# Patient Record
Sex: Female | Born: 1945 | Race: White | Hispanic: No | Marital: Married | Smoking: Never smoker
Health system: Southern US, Community
[De-identification: ages and names within clinical notes are randomized; demographics above are authoritative.]

## PROBLEM LIST (undated history)

## (undated) DIAGNOSIS — Z9889 Other specified postprocedural states: Secondary | ICD-10-CM

## (undated) DIAGNOSIS — K219 Gastro-esophageal reflux disease without esophagitis: Secondary | ICD-10-CM

## (undated) DIAGNOSIS — D126 Benign neoplasm of colon, unspecified: Secondary | ICD-10-CM

## (undated) DIAGNOSIS — I10 Essential (primary) hypertension: Secondary | ICD-10-CM

## (undated) DIAGNOSIS — K5790 Diverticulosis of intestine, part unspecified, without perforation or abscess without bleeding: Secondary | ICD-10-CM

## (undated) DIAGNOSIS — E785 Hyperlipidemia, unspecified: Secondary | ICD-10-CM

## (undated) DIAGNOSIS — Z8719 Personal history of other diseases of the digestive system: Secondary | ICD-10-CM

## (undated) DIAGNOSIS — R112 Nausea with vomiting, unspecified: Secondary | ICD-10-CM

## (undated) DIAGNOSIS — M1990 Unspecified osteoarthritis, unspecified site: Secondary | ICD-10-CM

## (undated) DIAGNOSIS — E039 Hypothyroidism, unspecified: Secondary | ICD-10-CM

## (undated) DIAGNOSIS — T7840XA Allergy, unspecified, initial encounter: Secondary | ICD-10-CM

## (undated) HISTORY — PX: THYROID SURGERY: SHX805

## (undated) HISTORY — PX: CERVICAL LAMINECTOMY: SHX94

## (undated) HISTORY — PX: UPPER GI ENDOSCOPY: SHX6162

## (undated) HISTORY — PX: CHOLECYSTECTOMY: SHX55

## (undated) HISTORY — DX: Diverticulosis of intestine, part unspecified, without perforation or abscess without bleeding: K57.90

## (undated) HISTORY — PX: WISDOM TOOTH EXTRACTION: SHX21

## (undated) HISTORY — DX: Hypothyroidism, unspecified: E03.9

## (undated) HISTORY — DX: Other specified postprocedural states: Z98.890

## (undated) HISTORY — DX: Gastro-esophageal reflux disease without esophagitis: K21.9

## (undated) HISTORY — DX: Benign neoplasm of colon, unspecified: D12.6

## (undated) HISTORY — PX: APPENDECTOMY: SHX54

## (undated) HISTORY — DX: Nausea with vomiting, unspecified: R11.2

## (undated) HISTORY — DX: Essential (primary) hypertension: I10

## (undated) HISTORY — PX: CERVICAL FUSION: SHX112

## (undated) HISTORY — DX: Unspecified osteoarthritis, unspecified site: M19.90

## (undated) HISTORY — DX: Hyperlipidemia, unspecified: E78.5

## (undated) HISTORY — PX: TONSILLECTOMY: SUR1361

## (undated) HISTORY — PX: ABLATION SAPHENOUS VEIN W/ RFA: SUR11

## (undated) HISTORY — DX: Allergy, unspecified, initial encounter: T78.40XA

---

## 2015-09-04 ENCOUNTER — Encounter: Payer: Self-pay | Admitting: "Endocrinology

## 2015-09-04 ENCOUNTER — Ambulatory Visit (INDEPENDENT_AMBULATORY_CARE_PROVIDER_SITE_OTHER): Payer: Medicare Other | Admitting: "Endocrinology

## 2015-09-04 VITALS — BP 121/78 | HR 81 | Ht 70.0 in | Wt 219.8 lb

## 2015-09-04 DIAGNOSIS — E890 Postprocedural hypothyroidism: Secondary | ICD-10-CM | POA: Diagnosis not present

## 2015-09-04 MED ORDER — LEVOTHYROXINE SODIUM 125 MCG PO TABS: 125.0000 ug | ORAL_TABLET | Freq: Every day | ORAL | Status: DC

## 2015-09-04 NOTE — Progress Notes (Signed)
Subjective:    Patient ID: Pamela Mckinney, female    DOB: 1946-08-28,    Past Medical History  Diagnosis Date  . Hyperlipidemia   . Hypothyroidism    Past Surgical History  Procedure Laterality Date  . Thyroid surgery    . Cholecystectomy    . Appendectomy     Social History   Social History  . Marital Status: Married    Spouse Name: N/A  . Number of Children: N/A  . Years of Education: N/A   Social History Main Topics  . Smoking status: Never Smoker   . Smokeless tobacco: Never Used  . Alcohol Use: Not on file  . Drug Use: Not on file  . Sexual Activity: Not on file   Other Topics Concern  . Not on file   Social History Narrative  . No narrative on file   Outpatient Encounter Prescriptions as of 09/04/2015  Medication Sig  . chlorthalidone (HYGROTON) 25 MG tablet Take 25 mg by mouth daily.  Marland Kitchen estradiol-norethindrone (ACTIVELLA) 1-0.5 MG tablet Take 1 tablet by mouth daily.  Marland Kitchen levothyroxine (SYNTHROID, LEVOTHROID) 125 MCG tablet Take 125 mcg by mouth daily before breakfast.  . lisinopril (PRINIVIL,ZESTRIL) 40 MG tablet Take 40 mg by mouth daily.  Marland Kitchen loratadine (CLARITIN) 10 MG tablet Take 10 mg by mouth daily.  . pantoprazole (PROTONIX) 40 MG tablet Take 40 mg by mouth daily.  . simvastatin (ZOCOR) 20 MG tablet Take 20 mg by mouth daily.   No facility-administered encounter medications on file as of 09/04/2015.   ALLERGIES: No Known Allergies VACCINATION STATUS:  There is no immunization history on file for this patient.  HPI  Pamela Mckinney is a 69 yr old female with medical hx as above. She is being seen in consultation for hypothyroidism . she reports hx of partial thyroidectomy 25 yrs ago for "cold nodule". she has taken thyroid hormone replacement at various doses over the years currently at 125 mcg. she is compliant. she denies cold/heat intolerance. she denies dysphagia, shortness of breath. she has had recent TFts which were found to be c/w  euthyroid status.  Review of Systems  Constitutional: + weight loss, no fatigue, no subjective hyperthermia/hypothermia Eyes: no blurry vision, no xerophthalmia ENT: no sore throat, no nodules palpated in throat, no dysphagia/odynophagia, no hoarseness Cardiovascular: no CP/SOB/palpitations/leg swelling Respiratory: no cough/SOB Gastrointestinal: no N/V/D/C Musculoskeletal: no muscle/joint aches Skin: no rashes Neurological: no tremors/numbness/tingling/dizziness Psychiatric: no depression/anxiety  Objective:    BP 121/78 mmHg  Pulse 81  Ht 5\' 10"  (1.778 m)  Wt 219 lb 12.8 oz (99.701 kg)  BMI 31.54 kg/m2  SpO2 96%  Wt Readings from Last 3 Encounters:  09/04/15 219 lb 12.8 oz (99.701 kg)    Physical Exam   Constitutional: overweight, in NAD Eyes: PERRLA, EOMI, no exophthalmos ENT: moist mucous membranes. Neck:  Post thyroidectomy scar on lower neck. No gross goiter.  no cervical lymphadenopathy Cardiovascular: RRR, No MRG Respiratory: CTA B Gastrointestinal: abdomen soft, NT, ND, BS+ Musculoskeletal: no deformities, strength intact in all 4 Skin: moist, warm, no rashes Neurological: no tremor with outstretched hands, DTR normal in all 4  On 08/28/2015 her free T4 was slightly elevated at 1.62 (normal 0.76-1.46), TSH 1.7.  Assessment & Plan:   1. Postsurgical hypothyroidism  I have reviewed her available records. she has established longstanding postsurgical hypothyroidism x 25 years after hemithyroidectomy for cold nodule.  she is clinically euthyroid. Her most recent thyroid function tests suggests over replacement which  would require a decrease in the dose of her Synthroid however since she is heading into the older season I would keep her on the same dose 125 g by mouth every morning. Once the court season is over will repeat thyroid function test and adjust the dose if necessary.  - We discussed about correct intake of levothyroxine, at fasting, with water,  separated by at least 30 minutes from breakfast, and separated by more than 4 hours from calcium, iron, multivitamins, acid reflux medications (PPIs). -Patient is made aware of the fact that thyroid hormone replacement is needed for life, dose to be adjusted by periodic monitoring of thyroid function tests.  Return in 5 months with repeat thyroid function test.   I advised patient to maintain close follow up with their PCP for primary care needs. Follow up plan: No Follow-up on file.  Glade Lloyd, MD Phone: [phone]  Fax: [phone]   09/04/2015, 8:53 AM

## 2015-09-23 ENCOUNTER — Encounter: Payer: Self-pay | Admitting: "Endocrinology

## 2016-01-29 LAB — TSH: TSH: 3.68 u[IU]/mL (ref <=5.90)

## 2016-02-05 ENCOUNTER — Encounter: Payer: Self-pay | Admitting: "Endocrinology

## 2016-02-05 ENCOUNTER — Ambulatory Visit (INDEPENDENT_AMBULATORY_CARE_PROVIDER_SITE_OTHER): Payer: Medicare Other | Admitting: "Endocrinology

## 2016-02-05 VITALS — BP 134/83 | HR 86 | Ht 70.0 in | Wt 225.0 lb

## 2016-02-05 DIAGNOSIS — E890 Postprocedural hypothyroidism: Secondary | ICD-10-CM

## 2016-02-05 MED ORDER — LEVOTHYROXINE SODIUM 125 MCG PO TABS: 125.0000 ug | ORAL_TABLET | Freq: Every day | ORAL | Status: DC

## 2016-02-05 NOTE — Progress Notes (Signed)
Subjective:    Patient ID: Pamela Mckinney, female    DOB: 11/01/1945,    Past Medical History  Diagnosis Date  . Hyperlipidemia   . Hypothyroidism   . Hypertension   . Osteoarthritis    Past Surgical History  Procedure Laterality Date  . Thyroid surgery    . Cholecystectomy    . Appendectomy    . Cervical laminectomy    . Cervical fusion    . Ablation saphenous vein w/ rfa     Social History   Social History  . Marital Status: Married    Spouse Name: N/A  . Number of Children: N/A  . Years of Education: N/A   Social History Main Topics  . Smoking status: Never Smoker   . Smokeless tobacco: Never Used  . Alcohol Use: Yes     Comment: occ wine  . Drug Use: No  . Sexual Activity: Not Asked   Other Topics Concern  . None   Social History Narrative   Outpatient Encounter Prescriptions as of 02/05/2016  Medication Sig  . cetirizine (ZYRTEC) 10 MG tablet Take 10 mg by mouth daily.  Marland Kitchen estradiol-norethindrone (ACTIVELLA) 1-0.5 MG tablet Take 1 tablet by mouth daily.  Marland Kitchen levothyroxine (SYNTHROID, LEVOTHROID) 125 MCG tablet Take 1 tablet (125 mcg total) by mouth daily before breakfast.  . lisinopril (PRINIVIL,ZESTRIL) 40 MG tablet Take 40 mg by mouth daily.  . pantoprazole (PROTONIX) 40 MG tablet Take 40 mg by mouth daily.  . simvastatin (ZOCOR) 20 MG tablet Take 20 mg by mouth daily.  . [DISCONTINUED] levothyroxine (SYNTHROID, LEVOTHROID) 125 MCG tablet Take 1 tablet (125 mcg total) by mouth daily before breakfast.  . loratadine (CLARITIN) 10 MG tablet Take 10 mg by mouth daily.  . [DISCONTINUED] chlorthalidone (HYGROTON) 25 MG tablet Take 25 mg by mouth daily.   No facility-administered encounter medications on file as of 02/05/2016.   ALLERGIES: No Known Allergies VACCINATION STATUS:  There is no immunization history on file for this patient.  HPI  Pamela Mckinney is a 70 yr old female with medical hx as above. She is being seen in consultation for hypothyroidism  . she reports hx of partial thyroidectomy 25 yrs ago for "cold nodule". she has taken thyroid hormone replacement at various doses over the years currently at 125 mcg. she is compliant. she denies cold/heat intolerance. she denies dysphagia, shortness of breath. she has had recent TFts which were found to be c/w euthyroid status.  Review of Systems  Constitutional:  + weight gain, no fatigue, no subjective hyperthermia/hypothermia Eyes: no blurry vision, no xerophthalmia ENT: no sore throat, no nodules palpated in throat, no dysphagia/odynophagia, no hoarseness Cardiovascular: no CP/SOB/palpitations/leg swelling Respiratory: no cough/SOB Gastrointestinal: no N/V/D/C Musculoskeletal: no muscle/joint aches Skin: no rashes Neurological: no tremors/numbness/tingling/dizziness Psychiatric: no depression/anxiety  Objective:    BP 134/83 mmHg  Pulse 86  Ht 5\' 10"  (1.778 m)  Wt 225 lb (102.059 kg)  BMI 32.28 kg/m2  SpO2 96%  Wt Readings from Last 3 Encounters:  02/05/16 225 lb (102.059 kg)  09/04/15 219 lb 12.8 oz (99.701 kg)    Physical Exam   Constitutional: overweight, in NAD Eyes: PERRLA, EOMI, no exophthalmos ENT: moist mucous membranes. Neck:  Post thyroidectomy scar on lower neck. No gross goiter.  no cervical lymphadenopathy Cardiovascular: RRR, No MRG Respiratory: CTA B Gastrointestinal: abdomen soft, NT, ND, BS+ Musculoskeletal: no deformities, strength intact in all 4 Skin: moist, warm, no rashes Neurological: no tremor with outstretched hands,  DTR normal in all 4  On 08/28/2015 her free T4 was slightly elevated at 1.62 (normal 0.76-1.46), TSH 1.7.  A previous 13th 2017 thyroid function tests showed: TSH 3.68 and free T4 1.42.  Assessment & Plan:   1. Postsurgical hypothyroidism  I have reviewed her available records. she has established longstanding postsurgical hypothyroidism x 25 years after hemithyroidectomy for cold nodule.   Her most recent thyroid  function tests suggests Appropriate replacement with thyroid hormone. I advised her to continue levothyroxine 125 g by mouth every morning.  - We discussed about correct intake of levothyroxine, at fasting, with water, separated by at least 30 minutes from breakfast, and separated by more than 4 hours from calcium, iron, multivitamins, acid reflux medications (PPIs). -Patient is made aware of the fact that thyroid hormone replacement is needed for life, dose to be adjusted by periodic monitoring of thyroid function tests.  Return in 5 months with repeat thyroid function test.   I advised patient to maintain close follow up with their PCP for primary care needs. Follow up plan: Return in about 1 year (around 02/04/2017) for follow up with pre-visit labs.  Glade Lloyd, MD Phone: [phone]  Fax: [phone]   02/05/2016, 11:14 AM

## 2016-02-17 ENCOUNTER — Encounter: Payer: Self-pay | Admitting: "Endocrinology

## 2016-08-18 HISTORY — PX: TOTAL KNEE ARTHROPLASTY: SHX125

## 2017-02-10 ENCOUNTER — Ambulatory Visit: Payer: Medicare Other | Admitting: "Endocrinology

## 2017-03-07 ENCOUNTER — Ambulatory Visit: Payer: Medicare Other | Admitting: "Endocrinology

## 2017-03-07 ENCOUNTER — Encounter: Payer: Self-pay | Admitting: "Endocrinology

## 2017-03-07 ENCOUNTER — Ambulatory Visit (INDEPENDENT_AMBULATORY_CARE_PROVIDER_SITE_OTHER): Payer: Medicare Other | Admitting: "Endocrinology

## 2017-03-07 VITALS — BP 131/85 | HR 86 | Ht 70.0 in | Wt 224.0 lb

## 2017-03-07 DIAGNOSIS — E890 Postprocedural hypothyroidism: Secondary | ICD-10-CM | POA: Diagnosis not present

## 2017-03-07 MED ORDER — LEVOTHYROXINE SODIUM 125 MCG PO TABS: 125.0000 ug | ORAL_TABLET | Freq: Every day | ORAL | 4 refills | Status: DC

## 2017-03-07 NOTE — Progress Notes (Signed)
Subjective:    Patient ID: Pamela Mckinney, female    DOB: Oct 05, 1946,    Past Medical History:  Diagnosis Date  . Hyperlipidemia   . Hypertension   . Hypothyroidism   . Osteoarthritis    Past Surgical History:  Procedure Laterality Date  . ABLATION SAPHENOUS VEIN W/ RFA    . APPENDECTOMY    . CERVICAL FUSION    . CERVICAL LAMINECTOMY    . CHOLECYSTECTOMY    . THYROID SURGERY     Social History   Social History  . Marital status: Married    Spouse name: N/A  . Number of children: N/A  . Years of education: N/A   Social History Main Topics  . Smoking status: Never Smoker  . Smokeless tobacco: Never Used  . Alcohol use Yes     Comment: occ wine  . Drug use: No  . Sexual activity: Not Asked   Other Topics Concern  . None   Social History Narrative  . None   Outpatient Encounter Prescriptions as of 03/07/2017  Medication Sig  . estradiol-norethindrone (ACTIVELLA) 1-0.5 MG tablet Take 1 tablet by mouth daily.  Marland Kitchen levothyroxine (SYNTHROID, LEVOTHROID) 125 MCG tablet Take 1 tablet (125 mcg total) by mouth daily before breakfast.  . lisinopril (PRINIVIL,ZESTRIL) 40 MG tablet Take 40 mg by mouth daily.  Marland Kitchen loratadine (CLARITIN) 10 MG tablet Take 10 mg by mouth daily.  . pantoprazole (PROTONIX) 40 MG tablet Take 40 mg by mouth daily.  . simvastatin (ZOCOR) 20 MG tablet Take 20 mg by mouth daily.  . [DISCONTINUED] cetirizine (ZYRTEC) 10 MG tablet Take 10 mg by mouth daily.   No facility-administered encounter medications on file as of 03/07/2017.    ALLERGIES: No Known Allergies VACCINATION STATUS:  There is no immunization history on file for this patient.  HPI  Pamela Mckinney is a 71 yr old female with medical hx as above. She is being seen in F/U for hypothyroidism . she reports hx of partial thyroidectomy 26 yrs ago for "cold nodule". she has taken thyroid hormone replacement at various doses over the years currently at 125 mcg. she is compliant. she denies  cold/heat intolerance. she denies dysphagia, shortness of breath. she has had recent TFts which included only TSH.  Review of Systems  Constitutional:  + steady weight, no fatigue, no subjective hyperthermia/hypothermia Eyes: no blurry vision, no xerophthalmia ENT: no sore throat, no nodules palpated in throat, no dysphagia/odynophagia, no hoarseness Cardiovascular: no CP/SOB/palpitations/leg swelling Respiratory: no cough/SOB Gastrointestinal: no N/V/D/C Musculoskeletal: no muscle/joint aches Skin: no rashes Neurological: no tremors/numbness/tingling/dizziness Psychiatric: no depression/anxiety  Objective:    BP 131/85   Pulse 86   Ht 5\' 10"  (1.778 m)   Wt 224 lb (101.6 kg)   BMI 32.14 kg/m   Wt Readings from Last 3 Encounters:  03/07/17 224 lb (101.6 kg)  02/05/16 225 lb (102.1 kg)  09/04/15 219 lb 12.8 oz (99.7 kg)    Physical Exam   Constitutional: overweight, in NAD Eyes: PERRLA, EOMI, no exophthalmos ENT: moist mucous membranes. Neck:  Post thyroidectomy scar on lower neck.  Palpable right lobe.  no cervical lymphadenopathy Cardiovascular: RRR, No MRG Respiratory: CTA B Gastrointestinal: abdomen soft, NT, ND, BS+ Musculoskeletal: healed scar on right knee after replacement. no deformities, strength intact in all 4 Skin: moist, warm, no rashes Neurological: no tremor with outstretched hands, DTR normal in all 4  On 08/28/2015 her free T4 was slightly elevated at 1.62 (normal 0.76-1.46), TSH  1.7.  January 29, 2016 thyroid function tests showed: TSH 3.68 and free T4 1.42. May 09,2018 TFT: TSH 3.03  Assessment & Plan:   1. Postsurgical hypothyroidism  I have reviewed her available records. she has established longstanding postsurgical hypothyroidism x 25 years after hemithyroidectomy for cold nodule.   Her most recent thyroid function tests suggests Appropriate replacement with thyroid hormone. I advised her to continue levothyroxine 125 g by mouth every  morning.  - We discussed about correct intake of levothyroxine, at fasting, with water, separated by at least 30 minutes from breakfast, and separated by more than 4 hours from calcium, iron, multivitamins, acid reflux medications (PPIs). -Patient is made aware of the fact that thyroid hormone replacement is needed for life, dose to be adjusted by periodic monitoring of thyroid function tests. I will obtain thyroid ultrasound given palpable lobe on right side.     I advised patient to maintain close follow up with their PCP for primary care needs. Follow up plan: Return in about 6 months (around 09/07/2017) for Thyroid / Neck Ultrasound, follow up with pre-visit labs.  Glade Lloyd, MD Phone: [phone]  Fax: [phone]   03/07/2017, 9:12 AM

## 2017-04-16 ENCOUNTER — Other Ambulatory Visit: Payer: Self-pay | Admitting: "Endocrinology

## 2017-08-29 ENCOUNTER — Encounter: Payer: Self-pay | Admitting: "Endocrinology

## 2017-09-01 LAB — TSH: TSH: 3.82 (ref 0.41–5.90)

## 2017-09-07 ENCOUNTER — Ambulatory Visit: Payer: Medicare Other | Admitting: "Endocrinology

## 2017-09-07 ENCOUNTER — Telehealth: Payer: Self-pay | Admitting: Gastroenterology

## 2017-09-12 ENCOUNTER — Encounter: Payer: Self-pay | Admitting: Gastroenterology

## 2017-09-13 ENCOUNTER — Encounter: Payer: Self-pay | Admitting: "Endocrinology

## 2017-09-13 ENCOUNTER — Ambulatory Visit (INDEPENDENT_AMBULATORY_CARE_PROVIDER_SITE_OTHER): Payer: Medicare Other | Admitting: "Endocrinology

## 2017-09-13 VITALS — BP 135/82 | HR 72 | Ht 70.0 in | Wt 228.0 lb

## 2017-09-13 DIAGNOSIS — E890 Postprocedural hypothyroidism: Secondary | ICD-10-CM

## 2017-09-13 MED ORDER — LEVOTHYROXINE SODIUM 150 MCG PO TABS: 150.0000 ug | ORAL_TABLET | Freq: Every day | ORAL | 2 refills | Status: DC

## 2017-09-13 NOTE — Progress Notes (Signed)
Subjective:    Patient ID: Pamela Mckinney, female    DOB: Feb 27, 1946,    Past Medical History:  Diagnosis Date  . Hyperlipidemia   . Hypertension   . Hypothyroidism   . Osteoarthritis    Past Surgical History:  Procedure Laterality Date  . ABLATION SAPHENOUS VEIN W/ RFA    . APPENDECTOMY    . CERVICAL FUSION    . CERVICAL LAMINECTOMY    . CHOLECYSTECTOMY    . THYROID SURGERY     Social History   Socioeconomic History  . Marital status: Married    Spouse name: None  . Number of children: None  . Years of education: None  . Highest education level: None  Social Needs  . Financial resource strain: None  . Food insecurity - worry: None  . Food insecurity - inability: None  . Transportation needs - medical: None  . Transportation needs - non-medical: None  Occupational History  . None  Tobacco Use  . Smoking status: Never Smoker  . Smokeless tobacco: Never Used  Substance and Sexual Activity  . Alcohol use: Yes    Comment: occ wine  . Drug use: No  . Sexual activity: None  Other Topics Concern  . None  Social History Narrative  . None   Outpatient Encounter Medications as of 09/13/2017  Medication Sig  . estradiol-norethindrone (ACTIVELLA) 1-0.5 MG tablet Take 1 tablet by mouth daily.  Marland Kitchen levothyroxine (SYNTHROID, LEVOTHROID) 150 MCG tablet Take 1 tablet (150 mcg total) by mouth daily before breakfast.  . lisinopril (PRINIVIL,ZESTRIL) 40 MG tablet Take 40 mg by mouth daily.  Marland Kitchen loratadine (CLARITIN) 10 MG tablet Take 10 mg by mouth daily.  . pantoprazole (PROTONIX) 40 MG tablet Take 40 mg by mouth daily.  . simvastatin (ZOCOR) 20 MG tablet Take 20 mg by mouth daily.  . [DISCONTINUED] levothyroxine (SYNTHROID, LEVOTHROID) 125 MCG tablet TAKE 1 TABLET BY MOUTH DAILY BEFORE BREAKFAST   No facility-administered encounter medications on file as of 09/13/2017.    ALLERGIES: No Known Allergies VACCINATION STATUS:  There is no immunization history on file for  this patient.  HPI  Pamela Mckinney is a 71 yr old female with medical history as above. 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  she reports history of left  partial thyroidectomy 26 yrs ago for "cold nodule". she has taken thyroid hormone replacement at various doses over the years currently at 125 mcg. she is compliant. she denies cold/heat intolerance. she denies dysphagia, shortness of breath.  Review of Systems  Constitutional:  +  Weight gain, no fatigue, no subjective hyperthermia/hypothermia Eyes: no blurry vision, no xerophthalmia ENT: no sore throat, no nodules palpated in throat, no dysphagia/odynophagia, no hoarseness Cardiovascular: no CP/SOB/palpitations/leg swelling Respiratory: no cough/SOB Gastrointestinal: no N/V/D/C Musculoskeletal: no muscle/joint aches Skin: no rashes Neurological: no tremors/numbness/tingling/dizziness Psychiatric: no depression/anxiety  Objective:    BP 135/82   Pulse 72   Ht 5\' 10"  (1.778 m)   Wt 228 lb (103.4 kg)   BMI 32.71 kg/m   Wt Readings from Last 3 Encounters:  09/13/17 228 lb (103.4 kg)  03/07/17 224 lb (101.6 kg)  02/05/16 225 lb (102.1 kg)    Physical Exam   Constitutional: overweight, in NAD Eyes: PERRLA, EOMI, no exophthalmos ENT: moist mucous membranes. Neck:  Post thyroidectomy scar on lower neck.  Palpable right lobe.  no cervical lymphadenopathy Cardiovascular: RRR, No MRG Respiratory: CTA B Gastrointestinal: abdomen soft, NT, ND, BS+ Musculoskeletal: healed  scar on right knee after replacement. no deformities, strength intact in all 4 Skin: moist, warm, no rashes Neurological: no tremor with outstretched hands, DTR normal in all 4  On 08/28/2015 her free T4 was slightly elevated at 1.62 (normal 0.76-1.46), TSH 1.7.  January 29, 2016 thyroid function tests showed: TSH 3.68 and free T4 1.42. 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  Assessment & Plan:   1. Postsurgical hypothyroidism  I have reviewed her available records. she has established longstanding postsurgical hypothyroidism x 25 years after hemithyroidectomy for cold nodule.   Her most recent thyroid function tests suggests underreplacement with thyroid hormone. - I advised her to increase her levothyroxine to 150 g by mouth every morning.   - We discussed about correct intake of levothyroxine, at fasting, with water, separated by at least 30 minutes from breakfast, and separated by more than 4 hours from calcium, iron, multivitamins, acid reflux medications (PPIs). -Patient is made aware of the fact that thyroid hormone replacement is needed for life, dose to be adjusted by periodic monitoring of thyroid function tests. Her thyroid ultrasound is unremarkable for any further nodular lesions. See above. No intervention required at this time.  I advised patient to maintain close follow up with her PCP for primary care needs. Follow up plan: Return in about 6 months (around 03/13/2018) for follow up with pre-visit labs.  Glade Lloyd, MD Phone: [phone]  Fax: [phone]  -  This note was partially dictated with voice recognition software. Similar sounding words can be transcribed inadequately or may not  be corrected upon review.  09/13/2017, 9:17 AM

## 2017-10-18 HISTORY — PX: JOINT REPLACEMENT: SHX530

## 2017-10-31 ENCOUNTER — Other Ambulatory Visit: Payer: Self-pay

## 2017-10-31 ENCOUNTER — Ambulatory Visit (AMBULATORY_SURGERY_CENTER): Payer: Self-pay | Admitting: *Deleted

## 2017-10-31 VITALS — Ht 70.0 in | Wt 224.0 lb

## 2017-10-31 DIAGNOSIS — Z86010 Personal history of colonic polyps: Secondary | ICD-10-CM

## 2017-10-31 MED ORDER — NA SULFATE-K SULFATE-MG SULF 17.5-3.13-1.6 GM/177ML PO SOLN: ORAL | 0 refills | Status: DC

## 2017-10-31 NOTE — Progress Notes (Signed)
Patient denies any allergies to eggs or soy. Patient does get post-op nausea and vomiting with anesthesia. Patient denies any problems with sedation. Patient denies any oxygen use at home. Patient denies taking any diet/weight loss medications or blood thinners. EMMI education declined by pt.

## 2017-11-03 ENCOUNTER — Telehealth: Payer: Self-pay | Admitting: Gastroenterology

## 2017-11-03 NOTE — Telephone Encounter (Signed)
Returned patients call. Patient states that the prep that she was given in [facility] was too expensive. Miralax prep was given as an alternative prep. Instructions will be mailed today. Reviewed prep instructions with patient and instructed to call back if they had further questions regarding instructions.    Riki Sheer, LPN  ( [facility])

## 2017-11-08 NOTE — Telephone Encounter (Signed)
Still has not received the instructions over the mail. Wants to speak to nurse Johann Capers or someone that can go over her prep over the phone. Says she lives 50 miles away and can not come gain or come pick up.

## 2017-11-08 NOTE — Telephone Encounter (Signed)
Spoke with patient. Explained that [place] LPN said the instructions were mailed out, so they should be at her house any day now. Told her what items (MIralax,Dulcolax and Gatorade) to buy and to call us back on Friday if they have not came in the mail and we will explain instructions over the phone. Pt understands. Patient states her Suprep was $90 and she could not afford that.

## 2017-11-15 ENCOUNTER — Other Ambulatory Visit: Payer: Self-pay

## 2017-11-15 ENCOUNTER — Ambulatory Visit (AMBULATORY_SURGERY_CENTER): Payer: Medicare Other | Admitting: Gastroenterology

## 2017-11-15 ENCOUNTER — Encounter: Payer: Self-pay | Admitting: Gastroenterology

## 2017-11-15 VITALS — BP 110/76 | HR 89 | Temp 98.0°F | Resp 13 | Ht 70.0 in | Wt 224.0 lb

## 2017-11-15 DIAGNOSIS — D122 Benign neoplasm of ascending colon: Secondary | ICD-10-CM | POA: Diagnosis not present

## 2017-11-15 DIAGNOSIS — Z86010 Personal history of colonic polyps: Secondary | ICD-10-CM

## 2017-11-15 MED ORDER — SODIUM CHLORIDE 0.9 % IV SOLN: 500.0000 mL | Freq: Once | INTRAVENOUS | Status: DC

## 2017-11-15 NOTE — Op Note (Signed)
[facility] Patient Name: Pamela Mckinney Procedure Date: 11/15/2017 10:10 AM MRN: [id number] Endoscopist: Remo Lipps P. Taylar Hartsough MD, MD Age: 72 Referring MD:  Date of Birth: 1946/03/18 Gender: Female Account #: [id number] Procedure:                Colonoscopy Indications:              High risk colon cancer surveillance: Personal                            history of colonic polyps Medicines:                Monitored Anesthesia Care Procedure:                Pre-Anesthesia Assessment:                           - Prior to the procedure, a History and Physical                            was performed, and patient medications and                            allergies were reviewed. The patient's tolerance of                            previous anesthesia was also reviewed. The risks                            and benefits of the procedure and the sedation                            options and risks were discussed with the patient.                            All questions were answered, and informed consent                            was obtained. Prior Anticoagulants: The patient has                            taken no previous anticoagulant or antiplatelet                            agents. ASA Grade Assessment: II - A patient with                            mild systemic disease. After reviewing the risks                            and benefits, the patient was deemed in                            satisfactory condition to undergo the procedure.  After obtaining informed consent, the colonoscope                            was passed under direct vision. Throughout the                            procedure, the patient's blood pressure, pulse, and                            oxygen saturations were monitored continuously. The                            Colonoscope was introduced through the anus and                            advanced to the the cecum,  identified by                            appendiceal orifice and ileocecal valve. The                            colonoscopy was performed without difficulty. The                            patient tolerated the procedure well. The quality                            of the bowel preparation was adequate. The                            ileocecal valve, appendiceal orifice, and rectum                            were photographed. Scope In: 10:17:57 AM Scope Out: 10:41:57 AM Scope Withdrawal Time: 0 hours 19 minutes 44 seconds  Total Procedure Duration: 0 hours 24 minutes 0 seconds  Findings:                 The perianal and digital rectal examinations were                            normal.                           Three sessile polyps were found in the ascending                            colon. The polyps were 3 to 6 mm in size (the                            largest was not photographed). These polyps were                            removed with a cold snare. Resection and retrieval  were complete.                           A few small-mouthed diverticula were found in the                            sigmoid colon.                           The colon was tortuous.                           The exam was otherwise without abnormality on                            direct and retroflexion views. Complications:            No immediate complications. Estimated blood loss:                            Minimal. Estimated Blood Loss:     Estimated blood loss was minimal. Impression:               - Three 3 to 6 mm polyps in the ascending colon,                            removed with a cold snare. Resected and retrieved.                           - Diverticulosis in the sigmoid colon.                           - Tortuous colon.                           - The examination was otherwise normal on direct                            and retroflexion views. Recommendation:            - Patient has a contact number available for                            emergencies. The signs and symptoms of potential                            delayed complications were discussed with the                            patient. Return to normal activities tomorrow.                            Written discharge instructions were provided to the                            patient.                           -  Resume previous diet.                           - Continue present medications.                           - Await pathology results.                           - Repeat colonoscopy is recommended for                            surveillance. The colonoscopy date will be                            determined after pathology results from today's                            exam become available for review. Remo Lipps P. Angeli Demilio MD, MD 11/15/2017 10:45:10 AM This report has been signed electronically.

## 2017-11-15 NOTE — Progress Notes (Signed)
Pt's states no medical or surgical changes since previsit or office visit. 

## 2017-11-15 NOTE — Patient Instructions (Signed)
YOU HAD AN ENDOSCOPIC PROCEDURE TODAY AT [facility]:   Refer to the procedure report that was given to you for any specific questions about what was found during the examination.  If the procedure report does not answer your questions, please call your gastroenterologist to clarify.  If you requested that your care partner not be given the details of your procedure findings, then the procedure report has been included in a sealed envelope for you to review at your convenience later.  YOU SHOULD EXPECT: Some feelings of bloating in the abdomen. Passage of more gas than usual.  Walking can help get rid of the air that was put into your GI tract during the procedure and reduce the bloating. If you had a lower endoscopy (such as a colonoscopy or flexible sigmoidoscopy) you may notice spotting of blood in your stool or on the toilet paper. If you underwent a bowel prep for your procedure, you may not have a normal bowel movement for a few days.  Please Note:  You might notice some irritation and congestion in your nose or some drainage.  This is from the oxygen used during your procedure.  There is no need for concern and it should clear up in a day or so.  SYMPTOMS TO REPORT IMMEDIATELY:   Following lower endoscopy (colonoscopy or flexible sigmoidoscopy):  Excessive amounts of blood in the stool  Significant tenderness or worsening of abdominal pains  Swelling of the abdomen that is new, acute  Fever of 100F or higher  Please see handouts given to you on Diverticulosis and Polyps.  For urgent or emergent issues, a gastroenterologist can be reached at any hour by calling [phone].   DIET:  We do recommend a small meal at first, but then you may proceed to your regular diet.  Drink plenty of fluids but you should avoid alcoholic beverages for 24 hours.  ACTIVITY:  You should plan to take it easy for the rest of today and you should NOT DRIVE or use heavy machinery until  tomorrow (because of the sedation medicines used during the test).    FOLLOW UP: Our staff will call the number listed on your records the next business day following your procedure to check on you and address any questions or concerns that you may have regarding the information given to you following your procedure. If we do not reach you, we will leave a message.  However, if you are feeling well and you are not experiencing any problems, there is no need to return our call.  We will assume that you have returned to your regular daily activities without incident.  If any biopsies were taken you will be contacted by phone or by letter within the next 1-3 weeks.  Please call us at [phone] if you have not heard about the biopsies in 3 weeks.    SIGNATURES/CONFIDENTIALITY: You and/or your care partner have signed paperwork which will be entered into your electronic medical record.  These signatures attest to the fact that that the information above on your After Visit Summary has been reviewed and is understood.  Full responsibility of the confidentiality of this discharge information lies with you and/or your care-partner.  Thank you for letting us take care of your healthcare needs today.

## 2017-11-15 NOTE — Progress Notes (Signed)
Called to room to assist during endoscopic procedure.  Patient ID and intended procedure confirmed with present staff. Received instructions for my participation in the procedure from the performing physician.  

## 2017-11-15 NOTE — Progress Notes (Signed)
Report given to PACU, vss 

## 2017-11-16 ENCOUNTER — Telehealth: Payer: Self-pay | Admitting: *Deleted

## 2017-11-16 NOTE — Telephone Encounter (Signed)
  Follow up Call-  Call back number 11/15/2017  Post procedure Call Back phone  # [phone]  Permission to leave phone message Yes     Patient questions:  Do you have a fever, pain , or abdominal swelling? No. Pain Score  0 *  Have you tolerated food without any problems? Yes.    Have you been able to return to your normal activities? Yes.    Do you have any questions about your discharge instructions: Diet   No. Medications  No. Follow up visit  No.  Do you have questions or concerns about your Care? No.  Actions: * If pain score is 4 or above: No action needed, pain <4.

## 2017-11-17 ENCOUNTER — Encounter: Payer: Self-pay | Admitting: Gastroenterology

## 2018-03-06 LAB — TSH: TSH: 0.38 — AB (ref 0.41–5.90)

## 2018-03-15 ENCOUNTER — Ambulatory Visit (INDEPENDENT_AMBULATORY_CARE_PROVIDER_SITE_OTHER): Payer: Medicare Other | Admitting: "Endocrinology

## 2018-03-15 ENCOUNTER — Encounter: Payer: Self-pay | Admitting: "Endocrinology

## 2018-03-15 VITALS — BP 129/84 | HR 85 | Ht 70.0 in | Wt 220.0 lb

## 2018-03-15 DIAGNOSIS — E890 Postprocedural hypothyroidism: Secondary | ICD-10-CM | POA: Diagnosis not present

## 2018-03-15 MED ORDER — LEVOTHYROXINE SODIUM 137 MCG PO TABS: 137.0000 ug | ORAL_TABLET | Freq: Every day | ORAL | 1 refills | Status: DC

## 2018-03-15 NOTE — Progress Notes (Signed)
Subjective:    Patient ID: Pamela Mckinney, female    DOB: 04/01/1946,    Past Medical History:  Diagnosis Date  . Allergy   . GERD (gastroesophageal reflux disease)   . Hyperlipidemia   . Hypertension   . Hypothyroidism   . Osteoarthritis   . Post-operative nausea and vomiting    Past Surgical History:  Procedure Laterality Date  . ABLATION SAPHENOUS VEIN W/ RFA    . APPENDECTOMY    . CERVICAL FUSION    . CERVICAL LAMINECTOMY    . CHOLECYSTECTOMY    . COLONOSCOPY    . THYROID SURGERY    . TONSILLECTOMY    . 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 Marital status: Married    Spouse name: Not on file  . Number of children: Not on file  . Years of education: Not on file  . Highest education level: Not on file  Occupational History  . Not on file  Social Needs  . Financial resource strain: Not on file  . Food insecurity:    Worry: Not on file    Inability: Not on file  . Transportation needs:    Medical: Not on file    Non-medical: Not on file  Tobacco Use  . Smoking status: Never Smoker  . Smokeless tobacco: Never Used  Substance and Sexual Activity  . Alcohol use: Yes    Comment: occ wine  . Drug use: No  . Sexual activity: Not on file  Lifestyle  . Physical activity:    Days per week: Not on file    Minutes per session: Not on file  . Stress: Not on file  Relationships  . Social connections:    Talks on phone: Not on file    Gets together: Not on file    Attends religious service: Not on file    Active member of club or organization: Not on file    Attends meetings of clubs or organizations: Not on file    Relationship status: Not on file  Other Topics Concern  . Not on file  Social History Narrative  . Not on file   Outpatient Encounter Medications as of 03/15/2018  Medication Sig  . Ibuprofen (ADVIL) 200 MG CAPS Take 1 capsule by mouth as needed.  Marland Kitchen levothyroxine (SYNTHROID, LEVOTHROID) 137 MCG  tablet Take 1 tablet (137 mcg total) by mouth daily before breakfast.  . lisinopril (PRINIVIL,ZESTRIL) 40 MG tablet Take 40 mg by mouth daily.  Marland Kitchen loratadine (CLARITIN) 10 MG tablet Take 10 mg by mouth daily.  . Multiple Vitamins-Minerals (CENTRUM ADULTS PO) Take 1 tablet by mouth daily.  . pantoprazole (PROTONIX) 40 MG tablet Take 40 mg by mouth daily.  . simvastatin (ZOCOR) 20 MG tablet Take 20 mg by mouth daily.  . [DISCONTINUED] levothyroxine (SYNTHROID, LEVOTHROID) 150 MCG tablet Take 1 tablet (150 mcg total) by mouth daily before breakfast.   Facility-Administered Encounter Medications as of 03/15/2018  Medication  . 0.9 %  sodium chloride infusion   ALLERGIES: Allergies  Allergen Reactions  . Codeine Nausea And Vomiting and Rash    Any pain medication   VACCINATION STATUS:  There is no immunization history on file for this patient.  HPI  Pamela Mckinney is a 72 year old female with medical history as above.  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    she reports history of left  partial thyroidectomy 26 yrs ago for "  cold nodule". she has taken thyroid hormone replacement at various doses over the years currently at 150 mcg. she is compliant. she denies cold/heat intolerance. she denies dysphagia, shortness of breath.  She denies tremors, palpitations, sleep disturbance.  Reports some mild hot flashes.  Review of Systems  Constitutional:  +  Weight loss, no fatigue, no subjective hyperthermia/hypothermia Eyes: no blurry vision, no xerophthalmia ENT: no sore throat, no nodules palpated in throat, no dysphagia/odynophagia, no hoarseness Cardiovascular: no CP/SOB/palpitations/leg swelling Respiratory: no cough/SOB Gastrointestinal: no N/V/D/C Musculoskeletal: no muscle/joint aches Skin: no rashes Neurological: no tremors/numbness/tingling/dizziness Psychiatric: no depression/anxiety  Objective:    BP 129/84   Pulse 85   Ht 5\' 10"  (1.778 m)   Wt 220  lb (99.8 kg)   BMI 31.57 kg/m   Wt Readings from Last 3 Encounters:  03/15/18 220 lb (99.8 kg)  11/15/17 224 lb (101.6 kg)  10/31/17 224 lb (101.6 kg)    Physical Exam   Constitutional:  overweight, not in acute distress.   Eyes: PERRLA, EOMI, no exophthalmos ENT: moist mucous membranes. Neck:  Post thyroidectomy scar on lower neck.  Palpable right lobe.  no cervical lymphadenopathy Musculoskeletal: healed scar on right knee after replacement.  Awaiting left knee replacement.    Skin: moist, warm, no rashes Neurological: no tremor with outstretched hands  Mar 06, 2018: Free T4 1.7 to elevated, TSH 0.38  On 08/28/2015 her free T4 was slightly elevated at 1.62 (normal 0.76-1.46), TSH 1.7.  January 29, 2016 thyroid function tests showed: TSH 3.68 and free T4 1.42. 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  Assessment & Plan:   1. Postsurgical hypothyroidism  I have reviewed her available records. she has established longstanding postsurgical hypothyroidism x 25 years after hemithyroidectomy for cold nodule.   Her most recent thyroid function tests suggests slight over replacement.   - I discussed and lowered her levothyroxine to 137 mcg p.o. A.m.   - We discussed about correct intake of levothyroxine, at fasting, with water, separated by at least 30 minutes from breakfast, and separated by more than 4 hours from calcium, iron, multivitamins, acid reflux medications (PPIs). -Patient is made aware of the fact that thyroid hormone replacement is needed for life, dose to be adjusted by periodic monitoring of thyroid function tests.  Her thyroid ultrasound is unremarkable for any further nodular lesions. See above. No intervention required at this time.  I advised patient to maintain close follow up with her PCP for  primary care needs. Follow up plan: Return in about 6 months (around 09/15/2018) for follow up with pre-visit labs.  Glade Lloyd, MD Phone: [phone]  Fax: [phone]  -  This note was partially dictated with voice recognition software. Similar sounding words can be transcribed inadequately or may not  be corrected upon review.  03/15/2018, 9:30 AM

## 2018-08-28 ENCOUNTER — Encounter: Payer: Self-pay | Admitting: "Endocrinology

## 2018-09-18 ENCOUNTER — Ambulatory Visit (INDEPENDENT_AMBULATORY_CARE_PROVIDER_SITE_OTHER): Payer: Medicare Other | Admitting: "Endocrinology

## 2018-09-18 ENCOUNTER — Encounter: Payer: Self-pay | Admitting: "Endocrinology

## 2018-09-18 VITALS — BP 130/81 | HR 87 | Ht 70.0 in | Wt 229.0 lb

## 2018-09-18 DIAGNOSIS — E890 Postprocedural hypothyroidism: Secondary | ICD-10-CM

## 2018-09-18 MED ORDER — LEVOTHYROXINE SODIUM 137 MCG PO TABS: 137.0000 ug | ORAL_TABLET | Freq: Every day | ORAL | 4 refills | Status: DC

## 2018-09-18 NOTE — Progress Notes (Signed)
Endocrinology follow-up note   Subjective:    Patient ID: Pamela Mckinney, female    DOB: January 26, 1946,    Past Medical History:  Diagnosis Date  . Allergy   . GERD (gastroesophageal reflux disease)   . Hyperlipidemia   . Hypertension   . Hypothyroidism   . Osteoarthritis   . Post-operative nausea and vomiting    Past Surgical History:  Procedure Laterality Date  . ABLATION SAPHENOUS VEIN W/ RFA    . APPENDECTOMY    . CERVICAL FUSION    . CERVICAL LAMINECTOMY    . CHOLECYSTECTOMY    . COLONOSCOPY    . THYROID SURGERY    . TONSILLECTOMY    . 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 Marital status: Married    Spouse name: Not on file  . Number of children: Not on file  . Years of education: Not on file  . Highest education level: Not on file  Occupational History  . Not on file  Social Needs  . Financial resource strain: Not on file  . Food insecurity:    Worry: Not on file    Inability: Not on file  . Transportation needs:    Medical: Not on file    Non-medical: Not on file  Tobacco Use  . Smoking status: Never Smoker  . Smokeless tobacco: Never Used  Substance and Sexual Activity  . Alcohol use: Yes    Comment: occ wine  . Drug use: No  . Sexual activity: Not on file  Lifestyle  . Physical activity:    Days per week: Not on file    Minutes per session: Not on file  . Stress: Not on file  Relationships  . Social connections:    Talks on phone: Not on file    Gets together: Not on file    Attends religious service: Not on file    Active member of club or organization: Not on file    Attends meetings of clubs or organizations: Not on file    Relationship status: Not on file  Other Topics Concern  . Not on file  Social History Narrative  . Not on file   Outpatient Encounter Medications as of 09/18/2018  Medication Sig  . Ibuprofen (ADVIL) 200 MG CAPS Take 1 capsule by mouth as needed.  Marland Kitchen levothyroxine  (SYNTHROID, LEVOTHROID) 137 MCG tablet Take 1 tablet (137 mcg total) by mouth daily before breakfast.  . lisinopril (PRINIVIL,ZESTRIL) 40 MG tablet Take 40 mg by mouth daily.  Marland Kitchen loratadine (CLARITIN) 10 MG tablet Take 10 mg by mouth daily.  . Multiple Vitamins-Minerals (CENTRUM ADULTS PO) Take 1 tablet by mouth daily.  . pantoprazole (PROTONIX) 40 MG tablet Take 40 mg by mouth daily.  . simvastatin (ZOCOR) 20 MG tablet Take 20 mg by mouth daily.  . [DISCONTINUED] levothyroxine (SYNTHROID, LEVOTHROID) 137 MCG tablet Take 1 tablet (137 mcg total) by mouth daily before breakfast.   Facility-Administered Encounter Medications as of 09/18/2018  Medication  . 0.9 %  sodium chloride infusion   ALLERGIES: Allergies  Allergen Reactions  . Codeine Nausea And Vomiting and Rash    Any pain medication   VACCINATION STATUS:  There is no immunization history on file for this patient.  HPI  Pamela Mckinney is a 72 year old female with medical history as above.  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    she reports history of left  partial thyroidectomy  at approximate age of 6 years due to  "cold nodule". she has taken thyroid hormone replacement at various doses over the years currently at 137 mcg. she is compliant. she denies palpitations, tremors, nor weight loss.  She reports some intermittent hot flashes.   she denies dysphagia, shortness of breath.  Review of Systems  Constitutional:  +  Weight gain,  no fatigue, no subjective hyperthermia/hypothermia Eyes: no blurry vision, no xerophthalmia ENT: no sore throat, no nodules palpated in throat, no dysphagia/odynophagia, no hoarseness Musculoskeletal: no muscle/joint aches Skin: no rashes Neurological: no tremors/numbness/tingling/dizziness Psychiatric: no depression/anxiety  Objective:    BP 130/81   Pulse 87   Ht 5\' 10"  (1.778 m)   Wt 229 lb (103.9 kg)   BMI 32.86 kg/m   Wt Readings from Last 3 Encounters:   09/18/18 229 lb (103.9 kg)  03/15/18 220 lb (99.8 kg)  11/15/17 224 lb (101.6 kg)    Physical Exam   Constitutional:  overweight, not in acute distress.   Eyes: PERRLA, EOMI, no exophthalmos ENT: moist mucous membranes. Neck:  Post thyroidectomy scar on lower neck.  Palpable right lobe.  no cervical lymphadenopathy Musculoskeletal: healed scar on right knee after replacement.  Awaiting left knee replacement.    Skin: moist, warm, no rashes Neurological: no tremor with outstretched hands    August 28, 2018 labs: Free T4 1.4 4, TSH 4.26 Mar 06, 2018: Free T4 1.7 to elevated, TSH 0.38 On 08/28/2015 her free T4 was slightly elevated at 1.62 (normal 0.76-1.46), TSH 1.7.  January 29, 2016 thyroid function tests showed: TSH 3.68 and free T4 1.42. 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  Assessment & Plan:   1. Postsurgical hypothyroidism  I have reviewed her available records. she has established longstanding postsurgical hypothyroidism at approximate age of 56 years  after left hemithyroidectomy for cold nodule.   Her previsit labs suggest appropriate replacement. -She is advised to continue levothyroxine 137 mcg p.o. every morning.   - We discussed about correct intake of levothyroxine, at fasting, with water, separated by at least 30 minutes from breakfast, and separated by more than 4 hours from calcium, iron, multivitamins, acid reflux medications (PPIs). -Patient is made aware of the fact that thyroid hormone replacement is needed for life, dose to be adjusted by periodic monitoring of thyroid function tests.  Her thyroid ultrasound is unremarkable for any further nodular lesions. See above. No intervention required at this time.  I advised patient to maintain close follow up with her PCP for primary care  needs. Follow up plan: Return in about 1 year (around 09/19/2019) for Follow up with Pre-visit Labs.  Glade Lloyd, MD Phone: [phone]  Fax: [phone]  -  This note was partially dictated with voice recognition software. Similar sounding words can be transcribed inadequately or may not  be corrected upon review.  09/18/2018, 10:11 AM

## 2018-10-18 HISTORY — PX: COLONOSCOPY: SHX174

## 2019-03-07 ENCOUNTER — Other Ambulatory Visit: Payer: Self-pay | Admitting: Neurosurgery

## 2019-03-19 HISTORY — PX: LUMBAR FUSION: SHX111

## 2019-04-03 ENCOUNTER — Other Ambulatory Visit: Payer: Self-pay | Admitting: Neurosurgery

## 2019-04-12 NOTE — Progress Notes (Signed)
Patient informed of the [place] that is currently in effect.  Patient verbalized understanding.  Patient denies shortness of breath, fever, cough and chest pain at PAT appointment  PCP - Dr Shearon Stalls in [place], [place] - Dr Gibson Ramp in [place], [place]  Endocrinologist: Dr Dorris Fetch in [place], [place]    Chest x-ray - Denies EKG - 04/13/19 Stress Test -Denies  ECHO - Denies Cardiac Cath - Denies  Anesthesia review: Yes  (03/22/19 EKG and cardiac clearance placed on chart along with copy of Advance Directive.)   Coronavirus Screening Have you or your daughter/husband experienced the following symptoms:  Cough yes/no: No Fever (>100.25F)  yes/no: No Runny nose yes/no: No Sore throat yes/no: No Difficulty breathing/shortness of breath  yes/no: No  Have you or your daughter/husband traveled in the last 14 days and where? yes/no: No

## 2019-04-12 NOTE — Pre-Procedure Instructions (Signed)
ELLISYN ICENHOWER  04/12/2019      [id number] [address], [place] [address] Phone: [phone] Fax: [phone]    Your procedure is scheduled on April 17, 2019.  Report to [facility] Entrance "A" at 800 AM.  Call this number if you have problems the morning of surgery:  [phone]   Remember:  Do not eat or drink after midnight.   Take these medicines the morning of surgery with A SIP OF WATER  Levothyroxine (synthroid) Loratadine (Claritin) Pantoprazole (Protonix)  7 days prior to surgery STOP taking any Aspirin (unless otherwise instructed by your surgeon), Aleve, Naproxen, Ibuprofen, Motrin, Advil, Goody's, BC's, all herbal medications, fish oil, and all vitamins     Do not wear jewelry, make-up or nail polish.  Do not wear lotions, powders, or perfumes, or deodorant.  Do not shave 48 hours prior to surgery.   Do not bring valuables to the hospital.  [facility] is not responsible for any belongings or valuables.  Contacts, dentures or bridgework may not be worn into surgery.  Leave your suitcase in the car.  After surgery it may be brought to your room.  For patients admitted to the hospital, discharge time will be determined by your treatment team.  Patients discharged the day of surgery will not be allowed to drive home.    [facility]- Preparing For Surgery  Before surgery, you can play an important role. Because skin is not sterile, your skin needs to be as free of germs as possible. You can reduce the number of germs on your skin by washing with CHG (chlorahexidine gluconate) Soap before surgery.  CHG is an antiseptic cleaner which kills germs and bonds with the skin to continue killing germs even after washing.    Oral Hygiene is also important to reduce your risk of infection.  Remember - BRUSH YOUR TEETH THE MORNING OF SURGERY WITH YOUR REGULAR TOOTHPASTE  Please do not use if you have an allergy to CHG or  antibacterial soaps. If your skin becomes reddened/irritated stop using the CHG.  Do not shave (including legs and underarms) for at least 48 hours prior to first CHG shower. It is OK to shave your face.  Please follow these instructions carefully.   1. Shower the NIGHT BEFORE SURGERY and the MORNING OF SURGERY with CHG.   2. If you chose to wash your hair, wash your hair first as usual with your normal shampoo.  3. After you shampoo, rinse your hair and body thoroughly to remove the shampoo.  4. Use CHG as you would any other liquid soap. You can apply CHG directly to the skin and wash gently with a scrungie or a clean washcloth.   5. Apply the CHG Soap to your body ONLY FROM THE NECK DOWN.  Do not use on open wounds or open sores. Avoid contact with your eyes, ears, mouth and genitals (private parts). Wash Face and genitals (private parts)  with your normal soap.  6. Wash thoroughly, paying special attention to the area where your surgery will be performed.  7. Thoroughly rinse your body with warm water from the neck down.  8. DO NOT shower/wash with your normal soap after using and rinsing off the CHG Soap.  9. Pat yourself dry with a CLEAN TOWEL.  10. Wear CLEAN PAJAMAS to bed the night before surgery, wear comfortable clothes the morning of surgery  11. Place CLEAN  SHEETS on your bed the night of your first shower and DO NOT SLEEP WITH PETS.    Day of Surgery:  Do not apply any deodorants/lotions.  Please wear clean clothes to the hospital/surgery center.   Remember to brush your teeth WITH YOUR REGULAR TOOTHPASTE.  Please read over the following fact sheets that you were given.

## 2019-04-13 ENCOUNTER — Encounter (HOSPITAL_COMMUNITY)
Admission: RE | Admit: 2019-04-13 | Discharge: 2019-04-13 | Disposition: A | Discharge status: Home / Self Care | Payer: Medicare Other | Source: Ambulatory Visit | Attending: Neurosurgery | Admitting: Neurosurgery

## 2019-04-13 ENCOUNTER — Other Ambulatory Visit: Payer: Self-pay

## 2019-04-13 ENCOUNTER — Other Ambulatory Visit (HOSPITAL_COMMUNITY)
Admission: RE | Admit: 2019-04-13 | Discharge: 2019-04-13 | Disposition: A | Discharge status: Home / Self Care | Payer: Medicare Other | Source: Ambulatory Visit | Attending: Neurosurgery | Admitting: Neurosurgery

## 2019-04-13 ENCOUNTER — Encounter (HOSPITAL_COMMUNITY): Payer: Self-pay

## 2019-04-13 DIAGNOSIS — E890 Postprocedural hypothyroidism: Secondary | ICD-10-CM | POA: Insufficient documentation

## 2019-04-13 DIAGNOSIS — K219 Gastro-esophageal reflux disease without esophagitis: Secondary | ICD-10-CM | POA: Insufficient documentation

## 2019-04-13 DIAGNOSIS — Z79899 Other long term (current) drug therapy: Secondary | ICD-10-CM | POA: Diagnosis not present

## 2019-04-13 DIAGNOSIS — I10 Essential (primary) hypertension: Secondary | ICD-10-CM | POA: Diagnosis not present

## 2019-04-13 DIAGNOSIS — Z1159 Encounter for screening for other viral diseases: Secondary | ICD-10-CM | POA: Diagnosis not present

## 2019-04-13 DIAGNOSIS — E785 Hyperlipidemia, unspecified: Secondary | ICD-10-CM | POA: Diagnosis not present

## 2019-04-13 DIAGNOSIS — M48062 Spinal stenosis, lumbar region with neurogenic claudication: Secondary | ICD-10-CM | POA: Insufficient documentation

## 2019-04-13 DIAGNOSIS — Z01812 Encounter for preprocedural laboratory examination: Secondary | ICD-10-CM | POA: Diagnosis present

## 2019-04-13 DIAGNOSIS — Z79890 Hormone replacement therapy (postmenopausal): Secondary | ICD-10-CM | POA: Insufficient documentation

## 2019-04-13 HISTORY — DX: Personal history of other diseases of the digestive system: Z87.19

## 2019-04-13 LAB — CBC
HCT: 46.6 % — ABNORMAL HIGH (ref 36.0–46.0)
Hemoglobin: 14.8 g/dL (ref 12.0–15.0)
MCH: 28.7 pg (ref 26.0–34.0)
MCHC: 31.8 g/dL (ref 30.0–36.0)
MCV: 90.3 fL (ref 80.0–100.0)
Platelets: 188 10*3/uL (ref 150–400)
RBC: 5.16 MIL/uL — ABNORMAL HIGH (ref 3.87–5.11)
RDW: 13.9 % (ref 11.5–15.5)
WBC: 9.6 10*3/uL (ref 4.0–10.5)
nRBC: 0 % (ref 0.0–0.2)

## 2019-04-13 LAB — BASIC METABOLIC PANEL
Anion gap: 8 (ref 5–15)
BUN: 19 mg/dL (ref 8–23)
CO2: 27 mmol/L (ref 22–32)
Calcium: 8.9 mg/dL (ref 8.9–10.3)
Chloride: 105 mmol/L (ref 98–111)
Creatinine, Ser: 0.96 mg/dL (ref 0.44–1.00)
GFR calc Af Amer: >60 mL/min (ref >60)
GFR calc non Af Amer: 58 mL/min — ABNORMAL LOW (ref >60)
Glucose, Bld: 97 mg/dL (ref 70–99)
Potassium: 5 mmol/L (ref 3.5–5.1)
Sodium: 140 mmol/L (ref 135–145)

## 2019-04-13 LAB — TYPE AND SCREEN
ABO/RH(D): A POS
Antibody Screen: NEGATIVE

## 2019-04-13 LAB — SURGICAL PCR SCREEN
MRSA, PCR: NEGATIVE
Staphylococcus aureus: NEGATIVE

## 2019-04-13 LAB — SARS CORONAVIRUS 2 (TAT 6-24 HRS): SARS Coronavirus 2: NEGATIVE

## 2019-04-13 LAB — ABO/RH: ABO/RH(D): A POS

## 2019-04-16 NOTE — Anesthesia Preprocedure Evaluation (Addendum)
Anesthesia Evaluation  Patient identified by MRN, date of birth, ID band Patient awake    Reviewed: Allergy & Precautions, NPO status , Patient's Chart, lab work & pertinent test results  History of Anesthesia Complications (+) PONVNegative for: history of anesthetic complications  Airway Mallampati: III  TM Distance: >3 FB Neck ROM: Limited    Dental  (+) Teeth Intact   Pulmonary neg pulmonary ROS,    Pulmonary exam normal        Cardiovascular hypertension, Pt. on medications Normal cardiovascular exam     Neuro/Psych negative neurological ROS  negative psych ROS   GI/Hepatic Neg liver ROS, hiatal hernia, GERD  ,  Endo/Other  Hypothyroidism   Renal/GU negative Renal ROS  negative genitourinary   Musculoskeletal negative musculoskeletal ROS (+)   Abdominal   Peds  Hematology negative hematology ROS (+)   Anesthesia Other Findings   Reproductive/Obstetrics                           Anesthesia Physical Anesthesia Plan  ASA: II  Anesthesia Plan: General   Post-op Pain Management:    Induction: Intravenous  PONV Risk Score and Plan: 4 or greater and Ondansetron, Dexamethasone, Scopolamine patch - Pre-op, Propofol infusion and Treatment may vary due to age or medical condition  Airway Management Planned: Oral ETT and Video Laryngoscope Planned  Additional Equipment: None  Intra-op Plan:   Post-operative Plan: Extubation in OR  Informed Consent: I have reviewed the patients History and Physical, chart, labs and discussed the procedure including the risks, benefits and alternatives for the proposed anesthesia with the patient or authorized representative who has indicated his/her understanding and acceptance.     Dental advisory given  Plan Discussed with:   Anesthesia Plan Comments: (Follows with cardiologist, Dr. Gibson Ramp, for HTN, HLD, venous insufficiency. She was seen  03/22/19 and cleared for surgery, "Revised cardiac index of 0. This puts her to 0.4% risk for perioperative complications. Proceed with surgery without further cardiac workup." OV note also states "Lexiscan 03-03-15, EF 70/normal."  EKG 03/22/19 (copy on pt chart): Sinus rhythm. Rate 82. Short PR. Nonspecific ST depression.)     Anesthesia Quick Evaluation

## 2019-04-16 NOTE — H&P (Signed)
Chief Complaint   Back pain  HPI   HPI: Pamela Mckinney is a 73 y.o. female with fairly chronic low back and right greater than left leg pain. Symptoms started approx 6-7 months ago without any identifiable inciting event.  Pain is worsened when she stands or walks for any length of time.  Sitting does seem to help ease off the pain.  She does not report any significant weakness of the legs.  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  She has attempted multiple different conservative treatments including anti-inflammatory medication, physical therapy, and epidural steroid injections.  Despite this, she has had progressive back and BLE pain.  She presents today for surgery. She is without any concerns.  Patient Active Problem List   Diagnosis Date Noted  . 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 Allergy   . GERD (gastroesophageal reflux disease)   . History of hiatal hernia   . Hyperlipidemia   . Hypertension   . Hypothyroidism   . Osteoarthritis   . Post-operative nausea and vomiting   . SVD (spontaneous vaginal delivery)    x 2    PSH: Past Surgical History:  Procedure Laterality Date  . ABLATION SAPHENOUS VEIN W/ RFA    . APPENDECTOMY    . CERVICAL FUSION    . CERVICAL LAMINECTOMY    . CHOLECYSTECTOMY    . COLONOSCOPY  10/2018   polyps  . JOINT REPLACEMENT Left 2019  . THYROID SURGERY    . TONSILLECTOMY    . TOTAL KNEE ARTHROPLASTY Right 08/2016  . UPPER GI ENDOSCOPY     Barrett's - no longer an issue per patient 04/13/19  . WISDOM TOOTH EXTRACTION      No medications prior to admission.    SH: Social History   Tobacco Use  . Smoking status: Never Smoker  . Smokeless tobacco: Never Used  Substance Use Topics  . Alcohol use: Yes    Comment: occ wine  . Drug use: No    MEDS: Prior to Admission medications   Medication Sig Start Date End Date Taking?  Authorizing Provider  levothyroxine (SYNTHROID, LEVOTHROID) 137 MCG tablet Take 1 tablet (137 mcg total) by mouth daily before breakfast. 09/18/18  Yes Nida, Marella Chimes, MD  lisinopril (PRINIVIL,ZESTRIL) 40 MG tablet Take 40 mg by mouth daily.   Yes [provider]  loratadine (CLARITIN) 10 MG tablet Take 10 mg by mouth daily.   Yes [provider]  pantoprazole (PROTONIX) 40 MG tablet Take 40 mg by mouth daily.   Yes [provider]  simvastatin (ZOCOR) 20 MG tablet Take 20 mg by mouth at bedtime.    Yes [provider]    ALLERGY: Allergies  Allergen Reactions  . Codeine Nausea And Vomiting, Rash and Other (See Comments)    Any pain medication    Social History   Tobacco Use  . Smoking status: Never Smoker  . Smokeless tobacco: Never Used  Substance Use Topics  . Alcohol use: Yes    Comment: occ wine     Family History  Problem Relation Age of Onset  . Allergies Mother   . Clotting disorder Mother   . Cancer Father        lung  . Stroke Maternal Grandmother   . Cancer Maternal Grandfather        colon  . Colon cancer Maternal Grandfather  70's     ROS   ROS  Exam   There were no vitals filed for this visit. General appearance: WDWN, NAD Eyes: No scleral injection Cardiovascular: Regular rate and rhythm without murmurs, rubs, gallops. No edema or variciosities. Distal pulses normal. Pulmonary: Effort normal, non-labored breathing Musculoskeletal:     Muscle tone upper extremities: Normal    Muscle tone lower extremities: Normal    Motor exam: Upper Extremities Deltoid Bicep Tricep Grip  Right 5/5 5/5 5/5 5/5  Left 5/5 5/5 5/5 5/5   Lower Extremity IP Quad PF DF EHL  Right 5/5 5/5 5/5 5/5 5/5  Left 5/5 5/5 5/5 5/5 5/5   Neurological Mental Status:    - Patient is awake, alert, oriented to person, place, month, year, and situation    - Patient is able to give a clear and coherent history.    - No signs of  aphasia or neglect Cranial Nerves    - II: Visual Fields are full. PERRL    - III/IV/VI: EOMI without ptosis or diploplia.     - V: Facial sensation is grossly normal    - VII: Facial movement is symmetric.     - VIII: hearing is intact to voice    - X: Uvula elevates symmetrically    - XI: Shoulder shrug is symmetric.    - XII: tongue is midline without atrophy or fasciculations.  Sensory: Sensation grossly intact to LT  Results - Imaging/Labs   No results found for this or any previous visit (from the past 48 hour(s)).  No results found.  IMAGING: MRI of the lumbar spine dated 12/21/2018 was reviewed.  This demonstrates maintenance of lumbar lordosis.  Primary finding is at L2-3 and L3-4.  At both levels there is broad-based disc bulge, with superimposed significant bilateral facet arthropathy and ligamentous hypertrophy resulting in severe stenosis at L2-3 and moderate to severe stenosis at L3-4.  Also seen is trace grade 1 anterolisthesis at L3-4.    Impression/Plan   73 y.o. 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  She has had progression of symptoms which limit daily activities despite reasonable conservative treatment. We will proceed with surgical decompression at L2-3 and L3-4, with placement of interbody cages, interbody arthrodesis, and posterior segmental instrumentation.   While in the office the risks which include but are not limited to nerve root injury leading to leg or foot weakness/numbness and/or bowel and bladder dysfunction, CSF leak, bleeding, and infection.  Possible outcomes of surgery were also discussed including the possibility of persistence or worsening of pain symptoms and the possibility of accelerated adjacent level degeneration. The general risks of anesthesia were also reviewed including heart attack, stroke, and DVT/PE.  The patient understood our discussion and  is willing to proceed with surgical decompression and fusion.  All questions today were answered.

## 2019-04-17 ENCOUNTER — Encounter (HOSPITAL_COMMUNITY): Payer: Self-pay | Admitting: *Deleted

## 2019-04-17 ENCOUNTER — Inpatient Hospital Stay (HOSPITAL_COMMUNITY): Payer: Medicare Other | Admitting: Certified Registered Nurse Anesthetist

## 2019-04-17 ENCOUNTER — Ambulatory Visit (HOSPITAL_COMMUNITY)
Admission: RE | Disposition: A | Discharge status: Home / Self Care | Payer: Self-pay | Source: Home / Self Care | Attending: Neurosurgery

## 2019-04-17 ENCOUNTER — Observation Stay (HOSPITAL_COMMUNITY)
Admission: RE | Admit: 2019-04-17 | Discharge: 2019-04-18 | Disposition: A | Discharge status: Home / Self Care | Payer: Medicare Other | Attending: Neurosurgery | Admitting: Neurosurgery

## 2019-04-17 ENCOUNTER — Other Ambulatory Visit: Payer: Self-pay

## 2019-04-17 ENCOUNTER — Inpatient Hospital Stay (HOSPITAL_COMMUNITY): Payer: Medicare Other

## 2019-04-17 DIAGNOSIS — M4316 Spondylolisthesis, lumbar region: Secondary | ICD-10-CM | POA: Insufficient documentation

## 2019-04-17 DIAGNOSIS — M48062 Spinal stenosis, lumbar region with neurogenic claudication: Principal | ICD-10-CM | POA: Insufficient documentation

## 2019-04-17 DIAGNOSIS — I10 Essential (primary) hypertension: Secondary | ICD-10-CM | POA: Diagnosis not present

## 2019-04-17 DIAGNOSIS — K219 Gastro-esophageal reflux disease without esophagitis: Secondary | ICD-10-CM | POA: Diagnosis not present

## 2019-04-17 DIAGNOSIS — Y838 Other surgical procedures as the cause of abnormal reaction of the patient, or of later complication, without mention of misadventure at the time of the procedure: Secondary | ICD-10-CM | POA: Insufficient documentation

## 2019-04-17 DIAGNOSIS — G9741 Accidental puncture or laceration of dura during a procedure: Secondary | ICD-10-CM | POA: Diagnosis not present

## 2019-04-17 DIAGNOSIS — E785 Hyperlipidemia, unspecified: Secondary | ICD-10-CM | POA: Insufficient documentation

## 2019-04-17 DIAGNOSIS — Z79890 Hormone replacement therapy (postmenopausal): Secondary | ICD-10-CM | POA: Insufficient documentation

## 2019-04-17 DIAGNOSIS — R42 Dizziness and giddiness: Secondary | ICD-10-CM | POA: Diagnosis not present

## 2019-04-17 DIAGNOSIS — Z79899 Other long term (current) drug therapy: Secondary | ICD-10-CM | POA: Diagnosis not present

## 2019-04-17 DIAGNOSIS — E890 Postprocedural hypothyroidism: Secondary | ICD-10-CM | POA: Insufficient documentation

## 2019-04-17 DIAGNOSIS — M48061 Spinal stenosis, lumbar region without neurogenic claudication: Secondary | ICD-10-CM | POA: Diagnosis present

## 2019-04-17 DIAGNOSIS — Z419 Encounter for procedure for purposes other than remedying health state, unspecified: Secondary | ICD-10-CM

## 2019-04-17 LAB — POCT I-STAT 4, (NA,K, GLUC, HGB,HCT)
Glucose, Bld: 97 mg/dL (ref 70–99)
HCT: 46 % (ref 36.0–46.0)
Hemoglobin: 15.6 g/dL — ABNORMAL HIGH (ref 12.0–15.0)
Potassium: 4 mmol/L (ref 3.5–5.1)
Sodium: 141 mmol/L (ref 135–145)

## 2019-04-17 SURGERY — POSTERIOR LUMBAR FUSION 2 LEVEL
Anesthesia: General | Site: Back

## 2019-04-17 MED ORDER — FENTANYL CITRATE (PF) 100 MCG/2ML IJ SOLN
25.0000 ug | INTRAMUSCULAR | Status: DC | PRN
  Administered 2019-04-17 (×3): 50 ug via INTRAVENOUS

## 2019-04-17 MED ORDER — FENTANYL CITRATE (PF) 100 MCG/2ML IJ SOLN
INTRAMUSCULAR | Status: AC
  Filled 2019-04-17: qty 2

## 2019-04-17 MED ORDER — HYDROCODONE-ACETAMINOPHEN 5-325 MG PO TABS: 1.0000 | ORAL_TABLET | ORAL | Status: DC | PRN

## 2019-04-17 MED ORDER — METHOCARBAMOL 500 MG PO TABS
500.0000 mg | ORAL_TABLET | Freq: Four times a day (QID) | ORAL | Status: DC | PRN
  Administered 2019-04-17: 500 mg via ORAL
  Filled 2019-04-17: qty 1

## 2019-04-17 MED ORDER — HYDROMORPHONE HCL 1 MG/ML IJ SOLN
INTRAMUSCULAR | Status: AC
  Filled 2019-04-17: qty 1

## 2019-04-17 MED ORDER — SODIUM CHLORIDE 0.9 % IV SOLN
INTRAVENOUS | Status: DC | PRN
  Administered 2019-04-17: 500 mL

## 2019-04-17 MED ORDER — SENNOSIDES-DOCUSATE SODIUM 8.6-50 MG PO TABS: 1.0000 | ORAL_TABLET | Freq: Every evening | ORAL | Status: DC | PRN

## 2019-04-17 MED ORDER — SUGAMMADEX SODIUM 200 MG/2ML IV SOLN
INTRAVENOUS | Status: DC | PRN
  Administered 2019-04-17: 250 mg via INTRAVENOUS

## 2019-04-17 MED ORDER — THROMBIN 5000 UNITS EX SOLR
OROMUCOSAL | Status: DC | PRN
  Administered 2019-04-17 (×3): 5 mL via TOPICAL

## 2019-04-17 MED ORDER — LACTATED RINGERS IV SOLN
INTRAVENOUS | Status: DC | PRN
  Administered 2019-04-17 (×3): via INTRAVENOUS

## 2019-04-17 MED ORDER — MIDAZOLAM HCL 2 MG/2ML IJ SOLN
INTRAMUSCULAR | Status: AC
  Filled 2019-04-17: qty 2

## 2019-04-17 MED ORDER — LEVOTHYROXINE SODIUM 137 MCG PO TABS
137.0000 ug | ORAL_TABLET | Freq: Every day | ORAL | Status: DC
  Administered 2019-04-18: 137 ug via ORAL
  Filled 2019-04-17: qty 1

## 2019-04-17 MED ORDER — SUFENTANIL CITRATE 50 MCG/ML IV SOLN
INTRAVENOUS | Status: AC
  Filled 2019-04-17: qty 1

## 2019-04-17 MED ORDER — OXYCODONE HCL 5 MG/5ML PO SOLN: 5.0000 mg | Freq: Once | ORAL | Status: DC | PRN

## 2019-04-17 MED ORDER — ONDANSETRON HCL 4 MG PO TABS: 4.0000 mg | ORAL_TABLET | Freq: Four times a day (QID) | ORAL | Status: DC | PRN

## 2019-04-17 MED ORDER — ONDANSETRON HCL 4 MG/2ML IJ SOLN
INTRAMUSCULAR | Status: AC
  Filled 2019-04-17: qty 2

## 2019-04-17 MED ORDER — LIDOCAINE HCL (CARDIAC) PF 100 MG/5ML IV SOSY
PREFILLED_SYRINGE | INTRAVENOUS | Status: DC | PRN
  Administered 2019-04-17: 30 mg via INTRAVENOUS

## 2019-04-17 MED ORDER — SENNA 8.6 MG PO TABS
1.0000 | ORAL_TABLET | Freq: Two times a day (BID) | ORAL | Status: DC
  Administered 2019-04-17: 8.6 mg via ORAL
  Filled 2019-04-17: qty 1

## 2019-04-17 MED ORDER — ACETAMINOPHEN 325 MG PO TABS: 650.0000 mg | ORAL_TABLET | ORAL | Status: DC | PRN

## 2019-04-17 MED ORDER — HYDROMORPHONE HCL 1 MG/ML IJ SOLN
0.2500 mg | INTRAMUSCULAR | Status: DC | PRN
  Administered 2019-04-17 (×2): 0.5 mg via INTRAVENOUS

## 2019-04-17 MED ORDER — HEMOSTATIC AGENTS (NO CHARGE) OPTIME
TOPICAL | Status: DC | PRN
  Administered 2019-04-17: 1 via TOPICAL

## 2019-04-17 MED ORDER — SCOPOLAMINE 1 MG/3DAYS TD PT72: 1.0000 | MEDICATED_PATCH | Freq: Once | TRANSDERMAL | Status: DC

## 2019-04-17 MED ORDER — MIDAZOLAM HCL 5 MG/5ML IJ SOLN
INTRAMUSCULAR | Status: DC | PRN
  Administered 2019-04-17: 2 mg via INTRAVENOUS

## 2019-04-17 MED ORDER — CHLORHEXIDINE GLUCONATE CLOTH 2 % EX PADS: 6.0000 | MEDICATED_PAD | Freq: Once | CUTANEOUS | Status: DC

## 2019-04-17 MED ORDER — CEFAZOLIN SODIUM-DEXTROSE 2-4 GM/100ML-% IV SOLN
2.0000 g | INTRAVENOUS | Status: AC
  Administered 2019-04-17 (×2): 2 g via INTRAVENOUS

## 2019-04-17 MED ORDER — LORATADINE 10 MG PO TABS
10.0000 mg | ORAL_TABLET | Freq: Every day | ORAL | Status: DC
  Administered 2019-04-18: 10 mg via ORAL
  Filled 2019-04-17: qty 1

## 2019-04-17 MED ORDER — DEXAMETHASONE SODIUM PHOSPHATE 10 MG/ML IJ SOLN
INTRAMUSCULAR | Status: DC | PRN
  Administered 2019-04-17: 10 mg via INTRAVENOUS

## 2019-04-17 MED ORDER — ROCURONIUM BROMIDE 10 MG/ML (PF) SYRINGE
PREFILLED_SYRINGE | INTRAVENOUS | Status: AC
  Filled 2019-04-17: qty 10

## 2019-04-17 MED ORDER — SODIUM CHLORIDE 0.9 % IV SOLN
INTRAVENOUS | Status: DC | PRN
  Administered 2019-04-17: 50 ug/min via INTRAVENOUS

## 2019-04-17 MED ORDER — PROPOFOL 10 MG/ML IV BOLUS
INTRAVENOUS | Status: DC | PRN
  Administered 2019-04-17: 120 mg via INTRAVENOUS

## 2019-04-17 MED ORDER — ONDANSETRON HCL 4 MG/2ML IJ SOLN
INTRAMUSCULAR | Status: DC | PRN
  Administered 2019-04-17: 4 mg via INTRAVENOUS

## 2019-04-17 MED ORDER — ROCURONIUM BROMIDE 50 MG/5ML IV SOSY
PREFILLED_SYRINGE | INTRAVENOUS | Status: DC | PRN
  Administered 2019-04-17 (×2): 10 mg via INTRAVENOUS
  Administered 2019-04-17: 60 mg via INTRAVENOUS
  Administered 2019-04-17: 10 mg via INTRAVENOUS

## 2019-04-17 MED ORDER — THROMBIN 5000 UNITS EX SOLR
CUTANEOUS | Status: AC
  Filled 2019-04-17: qty 5000

## 2019-04-17 MED ORDER — PANTOPRAZOLE SODIUM 40 MG PO TBEC
40.0000 mg | DELAYED_RELEASE_TABLET | Freq: Every day | ORAL | Status: DC
  Administered 2019-04-18: 10:00:00 40 mg via ORAL
  Filled 2019-04-17: qty 1

## 2019-04-17 MED ORDER — CEFAZOLIN SODIUM-DEXTROSE 2-4 GM/100ML-% IV SOLN
INTRAVENOUS | Status: AC
  Filled 2019-04-17: qty 100

## 2019-04-17 MED ORDER — LISINOPRIL 20 MG PO TABS: 40.0000 mg | ORAL_TABLET | Freq: Every day | ORAL | Status: DC

## 2019-04-17 MED ORDER — LACTATED RINGERS IV SOLN
INTRAVENOUS | Status: DC
  Administered 2019-04-17: 1000 mL via INTRAVENOUS

## 2019-04-17 MED ORDER — MENTHOL 3 MG MT LOZG: 1.0000 | LOZENGE | OROMUCOSAL | Status: DC | PRN

## 2019-04-17 MED ORDER — SCOPOLAMINE 1 MG/3DAYS TD PT72
MEDICATED_PATCH | TRANSDERMAL | Status: DC | PRN
  Administered 2019-04-17: 1 via TRANSDERMAL

## 2019-04-17 MED ORDER — BISACODYL 10 MG RE SUPP: 10.0000 mg | Freq: Every day | RECTAL | Status: DC | PRN

## 2019-04-17 MED ORDER — OXYCODONE HCL 5 MG PO TABS: 5.0000 mg | ORAL_TABLET | Freq: Once | ORAL | Status: DC | PRN

## 2019-04-17 MED ORDER — ACETAMINOPHEN 10 MG/ML IV SOLN
INTRAVENOUS | Status: AC
  Filled 2019-04-17: qty 100

## 2019-04-17 MED ORDER — PHENYLEPHRINE 40 MCG/ML (10ML) SYRINGE FOR IV PUSH (FOR BLOOD PRESSURE SUPPORT)
PREFILLED_SYRINGE | INTRAVENOUS | Status: AC
  Filled 2019-04-17: qty 10

## 2019-04-17 MED ORDER — ONDANSETRON HCL 4 MG/2ML IJ SOLN: 4.0000 mg | Freq: Four times a day (QID) | INTRAMUSCULAR | Status: DC | PRN

## 2019-04-17 MED ORDER — PHENYLEPHRINE HCL (PRESSORS) 10 MG/ML IV SOLN
INTRAVENOUS | Status: DC | PRN
  Administered 2019-04-17 (×2): 100 ug via INTRAVENOUS

## 2019-04-17 MED ORDER — GABAPENTIN 300 MG PO CAPS
300.0000 mg | ORAL_CAPSULE | Freq: Three times a day (TID) | ORAL | Status: DC
  Administered 2019-04-17 – 2019-04-18 (×3): 300 mg via ORAL
  Filled 2019-04-17 (×3): qty 1

## 2019-04-17 MED ORDER — SODIUM CHLORIDE 0.9% FLUSH
3.0000 mL | Freq: Two times a day (BID) | INTRAVENOUS | Status: DC
  Administered 2019-04-17: 3 mL via INTRAVENOUS

## 2019-04-17 MED ORDER — OXYCODONE HCL 5 MG PO TABS
5.0000 mg | ORAL_TABLET | ORAL | Status: DC | PRN
  Administered 2019-04-17 – 2019-04-18 (×2): 5 mg via ORAL
  Filled 2019-04-17 (×2): qty 1

## 2019-04-17 MED ORDER — ACETAMINOPHEN 500 MG PO TABS
1000.0000 mg | ORAL_TABLET | Freq: Four times a day (QID) | ORAL | Status: DC
  Administered 2019-04-17 – 2019-04-18 (×3): 1000 mg via ORAL
  Filled 2019-04-17 (×3): qty 2

## 2019-04-17 MED ORDER — ACETAMINOPHEN 10 MG/ML IV SOLN
INTRAVENOUS | Status: DC | PRN
  Administered 2019-04-17: 1000 mg via INTRAVENOUS

## 2019-04-17 MED ORDER — PROPOFOL 10 MG/ML IV BOLUS
INTRAVENOUS | Status: AC
  Filled 2019-04-17: qty 20

## 2019-04-17 MED ORDER — BUPIVACAINE HCL (PF) 0.5 % IJ SOLN
INTRAMUSCULAR | Status: AC
  Filled 2019-04-17: qty 30

## 2019-04-17 MED ORDER — ONDANSETRON HCL 4 MG/2ML IJ SOLN
4.0000 mg | Freq: Once | INTRAMUSCULAR | Status: AC | PRN
  Administered 2019-04-17: 4 mg via INTRAVENOUS

## 2019-04-17 MED ORDER — KETAMINE HCL 50 MG/5ML IJ SOSY
PREFILLED_SYRINGE | INTRAMUSCULAR | Status: AC
  Filled 2019-04-17: qty 5

## 2019-04-17 MED ORDER — HYDROXYZINE HCL 50 MG/ML IM SOLN
50.0000 mg | Freq: Four times a day (QID) | INTRAMUSCULAR | Status: DC | PRN
  Administered 2019-04-17: 50 mg via INTRAMUSCULAR
  Filled 2019-04-17: qty 1

## 2019-04-17 MED ORDER — LIDOCAINE-EPINEPHRINE 1 %-1:100000 IJ SOLN
INTRAMUSCULAR | Status: AC
  Filled 2019-04-17: qty 1

## 2019-04-17 MED ORDER — SUFENTANIL CITRATE 50 MCG/ML IV SOLN
INTRAVENOUS | Status: DC | PRN
  Administered 2019-04-17 (×3): 10 ug via INTRAVENOUS

## 2019-04-17 MED ORDER — HYDROMORPHONE HCL 1 MG/ML IJ SOLN: 0.5000 mg | INTRAMUSCULAR | Status: DC | PRN

## 2019-04-17 MED ORDER — FLEET ENEMA 7-19 GM/118ML RE ENEM: 1.0000 | ENEMA | Freq: Once | RECTAL | Status: DC | PRN

## 2019-04-17 MED ORDER — SODIUM CHLORIDE 0.9 % IV SOLN
INTRAVENOUS | Status: DC
  Administered 2019-04-17: 17:00:00 via INTRAVENOUS

## 2019-04-17 MED ORDER — DEXAMETHASONE SODIUM PHOSPHATE 10 MG/ML IJ SOLN
INTRAMUSCULAR | Status: AC
  Filled 2019-04-17: qty 1

## 2019-04-17 MED ORDER — SIMVASTATIN 20 MG PO TABS
20.0000 mg | ORAL_TABLET | Freq: Every day | ORAL | Status: DC
  Administered 2019-04-17: 20 mg via ORAL
  Filled 2019-04-17: qty 1

## 2019-04-17 MED ORDER — DOCUSATE SODIUM 100 MG PO CAPS
100.0000 mg | ORAL_CAPSULE | Freq: Two times a day (BID) | ORAL | Status: DC
  Administered 2019-04-17 – 2019-04-18 (×2): 100 mg via ORAL
  Filled 2019-04-17 (×2): qty 1

## 2019-04-17 MED ORDER — METHOCARBAMOL 1000 MG/10ML IJ SOLN
500.0000 mg | Freq: Four times a day (QID) | INTRAVENOUS | Status: DC | PRN
  Filled 2019-04-17: qty 5

## 2019-04-17 MED ORDER — CEFAZOLIN SODIUM-DEXTROSE 2-4 GM/100ML-% IV SOLN
2.0000 g | Freq: Three times a day (TID) | INTRAVENOUS | Status: AC
  Administered 2019-04-17 – 2019-04-18 (×2): 2 g via INTRAVENOUS
  Filled 2019-04-17 (×2): qty 100

## 2019-04-17 MED ORDER — KETOROLAC TROMETHAMINE 30 MG/ML IJ SOLN
INTRAMUSCULAR | Status: AC
  Filled 2019-04-17: qty 1

## 2019-04-17 MED ORDER — LIDOCAINE-EPINEPHRINE 1 %-1:100000 IJ SOLN
INTRAMUSCULAR | Status: DC | PRN
  Administered 2019-04-17: 5 mL

## 2019-04-17 MED ORDER — LIDOCAINE 2% (20 MG/ML) 5 ML SYRINGE
INTRAMUSCULAR | Status: AC
  Filled 2019-04-17: qty 5

## 2019-04-17 MED ORDER — ACETAMINOPHEN 650 MG RE SUPP: 650.0000 mg | RECTAL | Status: DC | PRN

## 2019-04-17 MED ORDER — 0.9 % SODIUM CHLORIDE (POUR BTL) OPTIME
TOPICAL | Status: DC | PRN
  Administered 2019-04-17: 1000 mL

## 2019-04-17 MED ORDER — SODIUM CHLORIDE 0.9% FLUSH: 3.0000 mL | INTRAVENOUS | Status: DC | PRN

## 2019-04-17 MED ORDER — BUPIVACAINE HCL (PF) 0.5 % IJ SOLN
INTRAMUSCULAR | Status: DC | PRN
  Administered 2019-04-17: 5 mL

## 2019-04-17 MED ORDER — KETAMINE HCL 10 MG/ML IJ SOLN
INTRAMUSCULAR | Status: DC | PRN
  Administered 2019-04-17: 20 mg via INTRAVENOUS
  Administered 2019-04-17 (×3): 10 mg via INTRAVENOUS

## 2019-04-17 MED ORDER — PHENOL 1.4 % MT LIQD: 1.0000 | OROMUCOSAL | Status: DC | PRN

## 2019-04-17 SURGICAL SUPPLY — 83 items
BAG DECANTER FOR FLEXI CONT (MISCELLANEOUS) ×3 IMPLANT
BASKET BONE COLLECTION (BASKET) ×3 IMPLANT
BENZOIN TINCTURE PRP APPL 2/3 (GAUZE/BANDAGES/DRESSINGS) IMPLANT
BIT DRILL 3.5 POWEREASE (BIT) ×1 IMPLANT
BIT DRILL 3.5MM POWEREASE (BIT) ×1
BLADE CLIPPER SURG (BLADE) IMPLANT
BLADE SURG 11 STRL SS (BLADE) ×3 IMPLANT
BUR MATCHSTICK NEURO 3.0 LAGG (BURR) ×3 IMPLANT
BUR PRECISION FLUTE 5.0 (BURR) ×3 IMPLANT
CANISTER SUCT 3000ML PPV (MISCELLANEOUS) ×3 IMPLANT
CARTRIDGE OIL MAESTRO DRILL (MISCELLANEOUS) ×1 IMPLANT
CLOSURE WOUND 1/2 X4 (GAUZE/BANDAGES/DRESSINGS)
CONT SPEC 4OZ CLIKSEAL STRL BL (MISCELLANEOUS) ×3 IMPLANT
COVER BACK TABLE 60X90IN (DRAPES) ×3 IMPLANT
COVER WAND RF STERILE (DRAPES) ×1 IMPLANT
DECANTER SPIKE VIAL GLASS SM (MISCELLANEOUS) ×3 IMPLANT
DERMABOND ADVANCED (GAUZE/BANDAGES/DRESSINGS) ×2
DERMABOND ADVANCED .7 DNX12 (GAUZE/BANDAGES/DRESSINGS) ×1 IMPLANT
DEVICE INTERBODY ELEVATE 23X7 (Cage) ×4 IMPLANT
DEVICE INTERBODY ELEVATE 23X8 (Cage) ×4 IMPLANT
DIFFUSER DRILL AIR PNEUMATIC (MISCELLANEOUS) ×3 IMPLANT
DRAPE C-ARM 42X72 X-RAY (DRAPES) ×3 IMPLANT
DRAPE C-ARMOR (DRAPES) ×3 IMPLANT
DRAPE LAPAROTOMY 100X72X124 (DRAPES) ×3 IMPLANT
DRAPE SURG 17X23 STRL (DRAPES) ×3 IMPLANT
DRSG OPSITE POSTOP 4X8 (GAUZE/BANDAGES/DRESSINGS) ×2 IMPLANT
DURAPREP 26ML APPLICATOR (WOUND CARE) ×3 IMPLANT
ELECT REM PT RETURN 9FT ADLT (ELECTROSURGICAL) ×3
ELECTRODE REM PT RTRN 9FT ADLT (ELECTROSURGICAL) ×1 IMPLANT
GAUZE 4X4 16PLY RFD (DISPOSABLE) IMPLANT
GAUZE SPONGE 4X4 12PLY STRL (GAUZE/BANDAGES/DRESSINGS) IMPLANT
GLOVE BIO SURGEON STRL SZ7.5 (GLOVE) ×4 IMPLANT
GLOVE BIOGEL PI IND STRL 7.0 (GLOVE) IMPLANT
GLOVE BIOGEL PI IND STRL 7.5 (GLOVE) ×2 IMPLANT
GLOVE BIOGEL PI INDICATOR 7.0 (GLOVE) ×4
GLOVE BIOGEL PI INDICATOR 7.5 (GLOVE) ×16
GLOVE ECLIPSE 7.0 STRL STRAW (GLOVE) ×10 IMPLANT
GLOVE EXAM NITRILE XL STR (GLOVE) IMPLANT
GLOVE SURG SS PI 7.0 STRL IVOR (GLOVE) ×8 IMPLANT
GOWN STRL REUS W/ TWL LRG LVL3 (GOWN DISPOSABLE) ×2 IMPLANT
GOWN STRL REUS W/ TWL XL LVL3 (GOWN DISPOSABLE) IMPLANT
GOWN STRL REUS W/TWL 2XL LVL3 (GOWN DISPOSABLE) IMPLANT
GOWN STRL REUS W/TWL LRG LVL3 (GOWN DISPOSABLE) ×16
GOWN STRL REUS W/TWL XL LVL3 (GOWN DISPOSABLE)
GRAFT DURAGEN MATRIX 1WX1L (Tissue) ×2 IMPLANT
HEMOSTAT POWDER KIT SURGIFOAM (HEMOSTASIS) ×7 IMPLANT
KIT BASIN OR (CUSTOM PROCEDURE TRAY) ×3 IMPLANT
KIT INFUSE X SMALL 1.4CC (Orthopedic Implant) ×2 IMPLANT
KIT POSITION SURG JACKSON T1 (MISCELLANEOUS) ×3 IMPLANT
KIT TURNOVER KIT B (KITS) ×3 IMPLANT
MILL MEDIUM DISP (BLADE) ×3 IMPLANT
NDL HYPO 18GX1.5 BLUNT FILL (NEEDLE) IMPLANT
NDL SPNL 18GX3.5 QUINCKE PK (NEEDLE) IMPLANT
NEEDLE HYPO 18GX1.5 BLUNT FILL (NEEDLE) IMPLANT
NEEDLE HYPO 22GX1.5 SAFETY (NEEDLE) ×3 IMPLANT
NEEDLE SPNL 18GX3.5 QUINCKE PK (NEEDLE) ×3 IMPLANT
NS IRRIG 1000ML POUR BTL (IV SOLUTION) ×3 IMPLANT
OIL CARTRIDGE MAESTRO DRILL (MISCELLANEOUS) ×3
PACK LAMINECTOMY NEURO (CUSTOM PROCEDURE TRAY) ×3 IMPLANT
PAD ARMBOARD 7.5X6 YLW CONV (MISCELLANEOUS) ×9 IMPLANT
PATTIES SURGICAL 1X1 (DISPOSABLE) ×2 IMPLANT
ROD SOLERA 55MM (Rod) ×2 IMPLANT
ROD SOLERA 60MM (Rod) ×2 IMPLANT
SCREW 5.5X35MM (Screw) ×12 IMPLANT
SCREW BN 35X5.5XMA NS SPNE (Screw) IMPLANT
SCREW SET SOLERA (Screw) ×12 IMPLANT
SCREW SET SOLERA TI (Screw) IMPLANT
SEALANT ADHERUS EXTEND TIP (MISCELLANEOUS) ×2 IMPLANT
SPACER SPNL XLORDOTIC 23X8X (Cage) IMPLANT
SPCR SPNL XLORDOTIC 23X8X (Cage) ×2 IMPLANT
SPONGE LAP 4X18 RFD (DISPOSABLE) ×2 IMPLANT
SPONGE SURGIFOAM ABS GEL 100 (HEMOSTASIS) IMPLANT
STRIP CLOSURE SKIN 1/2X4 (GAUZE/BANDAGES/DRESSINGS) IMPLANT
SUT NURALON 4 0 TR CR/8 (SUTURE) ×2 IMPLANT
SUT VIC AB 0 CT1 18XCR BRD8 (SUTURE) ×1 IMPLANT
SUT VIC AB 0 CT1 8-18 (SUTURE) ×6
SUT VIC AB 3-0 FS2 27 (SUTURE) IMPLANT
SUT VICRYL 3-0 RB1 18 ABS (SUTURE) ×5 IMPLANT
SYR 3ML LL SCALE MARK (SYRINGE) ×9 IMPLANT
TOWEL GREEN STERILE (TOWEL DISPOSABLE) ×3 IMPLANT
TOWEL GREEN STERILE FF (TOWEL DISPOSABLE) ×3 IMPLANT
TRAY FOLEY MTR SLVR 16FR STAT (SET/KITS/TRAYS/PACK) ×3 IMPLANT
WATER STERILE IRR 1000ML POUR (IV SOLUTION) ×3 IMPLANT

## 2019-04-17 NOTE — Progress Notes (Signed)
Orthopedic Tech Progress Note Patient Details:  Pamela Mckinney 01/29/1946 [id number] RN said patient has brace. Patient ID: Pamela Mckinney, female   DOB: [birth year], 73 y.o.   MRN: [id number]   [name] 04/17/2019, 5:49 PM

## 2019-04-17 NOTE — Anesthesia Procedure Notes (Signed)
Procedure Name: Intubation Date/Time: 04/17/2019 10:45 AM Performed by: Eligha Bridegroom, CRNA Pre-anesthesia Checklist: Patient identified, Emergency Drugs available, Suction available, Patient being monitored and Timeout performed Patient Re-evaluated:Patient Re-evaluated prior to induction Oxygen Delivery Method: Circle system utilized Preoxygenation: Pre-oxygenation with 100% oxygen Induction Type: IV induction Ventilation: Mask ventilation without difficulty

## 2019-04-17 NOTE — Anesthesia Procedure Notes (Signed)
Procedure Name: Intubation Date/Time: 04/17/2019 10:45 AM Performed by: Eligha Bridegroom, CRNA Pre-anesthesia Checklist: Patient identified, Emergency Drugs available, Suction available, Patient being monitored and Timeout performed Patient Re-evaluated:Patient Re-evaluated prior to induction Oxygen Delivery Method: Circle system utilized Preoxygenation: Pre-oxygenation with 100% oxygen Induction Type: IV induction Ventilation: Mask ventilation without difficulty and Oral airway inserted - appropriate to patient size Laryngoscope Size: Glidescope Grade View: Grade III Tube size: 7.0 mm Number of attempts: 2 Airway Equipment and Method: Stylet and Video-laryngoscopy Placement Confirmation: ETT inserted through vocal cords under direct vision,  positive ETCO2 and breath sounds checked- equal and bilateral Secured at: 21 cm Tube secured with: Tape Dental Injury: Teeth and Oropharynx as per pre-operative assessment

## 2019-04-17 NOTE — Anesthesia Postprocedure Evaluation (Signed)
Anesthesia Post Note  Patient: Pamela Mckinney  Procedure(s) Performed: POSTERIOR LUMBAR INTERBODY FUSION LUMBAR TWO - LUMBAR THREE, LUMBAR THREE- LUMBAR FOUR, INTERBODY CAGES, INTERBODY ARTHRODESIS, POSTERIOR SEGMENTAL INSTRUMENTATION (N/A Back)     Patient location during evaluation: PACU Anesthesia Type: General Level of consciousness: awake and alert Pain management: pain level controlled Vital Signs Assessment: post-procedure vital signs reviewed and stable Respiratory status: spontaneous breathing, nonlabored ventilation, respiratory function stable and patient connected to nasal cannula oxygen Cardiovascular status: blood pressure returned to baseline and stable Postop Assessment: no apparent nausea or vomiting Anesthetic complications: no    Last Vitals:  Vitals:   04/17/19 1615 04/17/19 1705  BP: 135/68 120/66  Pulse: 72 86  Resp: 15 18  Temp:  36.8 C  SpO2: 95% 93%    Last Pain:  Vitals:   04/17/19 1705  TempSrc: Oral  PainSc:                  Lidia Collum

## 2019-04-17 NOTE — Transfer of Care (Signed)
22Immediate Anesthesia Transfer of Care Note  Patient: YULIETH CARRENDER  Procedure(s) Performed: POSTERIOR LUMBAR INTERBODY FUSION LUMBAR TWO - LUMBAR THREE, LUMBAR THREE- LUMBAR FOUR, INTERBODY CAGES, INTERBODY ARTHRODESIS, POSTERIOR SEGMENTAL INSTRUMENTATION (N/A Back)  Patient Location: PACU  Anesthesia Type:General  Level of Consciousness: awake, alert  and oriented  Airway & Oxygen Therapy: Patient Spontanous Breathing  Post-op Assessment: Report given to RN and Post -op Vital signs reviewed and stable  Post vital signs: Reviewed and stable  Last Vitals:  Vitals Value Taken Time  BP 125/105 04/17/19 1531  Temp    Pulse 80 04/17/19 1531  Resp 14 04/17/19 1531  SpO2 100 % 04/17/19 1531  Vitals shown include unvalidated device data.  Last Pain:  Vitals:   04/17/19 0853  TempSrc:   PainSc: 0-No pain      Patients Stated Pain Goal: 3 (31/42/76 7011)  Complications: No apparent anesthesia complications

## 2019-04-18 DIAGNOSIS — M48062 Spinal stenosis, lumbar region with neurogenic claudication: Secondary | ICD-10-CM | POA: Diagnosis not present

## 2019-04-18 LAB — CBC
HCT: 37.6 % (ref 36.0–46.0)
Hemoglobin: 12.3 g/dL (ref 12.0–15.0)
MCH: 28.9 pg (ref 26.0–34.0)
MCHC: 32.7 g/dL (ref 30.0–36.0)
MCV: 88.3 fL (ref 80.0–100.0)
Platelets: 206 10*3/uL (ref 150–400)
RBC: 4.26 MIL/uL (ref 3.87–5.11)
RDW: 14 % (ref 11.5–15.5)
WBC: 17.3 10*3/uL — ABNORMAL HIGH (ref 4.0–10.5)
nRBC: 0 % (ref 0.0–0.2)

## 2019-04-18 LAB — PROTIME-INR
INR: 1.2 (ref 0.8–1.2)
Prothrombin Time: 14.9 seconds (ref 11.4–15.2)

## 2019-04-18 LAB — BASIC METABOLIC PANEL
Anion gap: 12 (ref 5–15)
BUN: 14 mg/dL (ref 8–23)
CO2: 24 mmol/L (ref 22–32)
Calcium: 8.2 mg/dL — ABNORMAL LOW (ref 8.9–10.3)
Chloride: 101 mmol/L (ref 98–111)
Creatinine, Ser: 1.17 mg/dL — ABNORMAL HIGH (ref 0.44–1.00)
GFR calc Af Amer: 53 mL/min — ABNORMAL LOW (ref >60)
GFR calc non Af Amer: 46 mL/min — ABNORMAL LOW (ref >60)
Glucose, Bld: 144 mg/dL — ABNORMAL HIGH (ref 70–99)
Potassium: 4.8 mmol/L (ref 3.5–5.1)
Sodium: 137 mmol/L (ref 135–145)

## 2019-04-18 LAB — APTT: aPTT: 28 seconds (ref 24–36)

## 2019-04-18 MED ORDER — HYDROXYZINE PAMOATE 25 MG PO CAPS: 25.0000 mg | ORAL_CAPSULE | Freq: Three times a day (TID) | ORAL | 0 refills | Status: DC | PRN

## 2019-04-18 MED ORDER — HYDROXYZINE HCL 25 MG PO TABS
25.0000 mg | ORAL_TABLET | Freq: Three times a day (TID) | ORAL | Status: DC | PRN
  Administered 2019-04-18: 50 mg via ORAL
  Filled 2019-04-18: qty 2

## 2019-04-18 MED ORDER — SODIUM CHLORIDE 0.9 % IV BOLUS
500.0000 mL | Freq: Once | INTRAVENOUS | Status: AC
  Administered 2019-04-18: 500 mL via INTRAVENOUS

## 2019-04-18 MED ORDER — OXYCODONE-ACETAMINOPHEN 7.5-325 MG PO TABS: 1.0000 | ORAL_TABLET | ORAL | 0 refills | Status: DC | PRN

## 2019-04-18 MED FILL — Thrombin For Soln 5000 Unit: CUTANEOUS | Qty: 5000 | Status: AC

## 2019-04-18 MED FILL — Gelatin Absorbable MT Powder: OROMUCOSAL | Qty: 1 | Status: AC

## 2019-04-18 NOTE — Evaluation (Signed)
Physical Therapy Evaluation Patient Details Name: Pamela Mckinney MRN: [id number] DOB: 10-01-1946 Today's Date: 04/18/2019   History of Present Illness  Pt s/p L2-L4 PLIF.  Clinical Impression  Patient is s/p above surgery resulting in the deficits listed below (see PT Problem List). Pt functioning at min guard. Pt with noted memory deficits. Pt reports he daughter will be staying with her a week. Patient will benefit from skilled PT to increase their independence and safety with mobility (while adhering to their precautions) to allow discharge to the venue listed below.     Follow Up Recommendations No PT follow up;Supervision/Assistance - 24 hour    Equipment Recommendations  None recommended by PT(has RW)    Recommendations for Other Services       Precautions / Restrictions Precautions Precautions: Back Precaution Comments: Reviewed 3/3 back precautions. Required Braces or Orthoses: Spinal Brace Spinal Brace: Lumbar corset;Applied in sitting position Restrictions Weight Bearing Restrictions: No      Mobility  Bed Mobility Overal bed mobility: (pt sitting EOB)             General bed mobility comments: Pt able to verbalize log roll technique.  Transfers Overall transfer level: Needs assistance Equipment used: None Transfers: Sit to/from Stand Sit to Stand: Supervision         General transfer comment: increased time, verbal cues to minimize trunk flexion, slow and guarded powering up  Ambulation/Gait Ambulation/Gait assistance: Min guard Gait Distance (Feet): 200 Feet Assistive device: None Gait Pattern/deviations: Step-through pattern;Decreased stride length;Trunk flexed Gait velocity: dec Gait velocity interpretation: <1.31 ft/sec, indicative of household ambulator General Gait Details: pt with trunk flexion and very short step length for height, pt using hallway rail and was offered a RW however declined, encouraged isometric abdominal contraction to  help support the back during ambulation  Stairs Stairs: Yes Stairs assistance: Min guard Stair Management: One rail Right;One rail Left;Alternating pattern;Step to pattern Number of Stairs: 4 General stair comments: max directional verbal cues, pt with difficulty following commands  Wheelchair Mobility    Modified Rankin (Stroke Patients Only)       Balance Overall balance assessment: Needs assistance Sitting-balance support: Feet supported;No upper extremity supported Sitting balance-Leahy Scale: Fair     Standing balance support: No upper extremity supported Standing balance-Leahy Scale: Fair Standing balance comment: pt prefers to have unilateral UE support                             Pertinent Vitals/Pain Pain Assessment: 0-10 Pain Score: 3  Pain Location: back Pain Descriptors / Indicators: Operative site guarding Pain Intervention(s): Monitored during session    Home Living Family/patient expects to be discharged to:: Private residence Living Arrangements: Spouse/significant other Available Help at Discharge: Family;Available 24 hours/day Type of Home: House Home Access: Stairs to enter   [employer] of Steps: 1 Home Layout: Able to live on main level with bedroom/bathroom Home Equipment: Tub bench;Adaptive equipment Additional Comments: has a flight of stairs to basement    Prior Function Level of Independence: Independent         Comments: cares for spouse who has memory deficits     Hand Dominance   Dominant Hand: Right    Extremity/Trunk Assessment   Upper Extremity Assessment Upper Extremity Assessment: Defer to OT evaluation    Lower Extremity Assessment Lower Extremity Assessment: Generalized weakness    Cervical / Trunk Assessment Cervical / Trunk Assessment: Other exceptions Cervical /  Trunk Exceptions: back surgery  Communication   Communication: No difficulties  Cognition Arousal/Alertness:  Awake/alert Behavior During Therapy: WFL for tasks assessed/performed Overall Cognitive Status: Impaired/Different from baseline Area of Impairment: Problem solving;Memory                     Memory: Decreased short-term memory;Decreased recall of precautions       Problem Solving: Slow processing;Difficulty sequencing General Comments: pt with difficulty sequencing on stairs, pt with poor recall of precautions and noted short term memory deficits      General Comments General comments (skin integrity, edema, etc.): vss    Exercises     Assessment/Plan    PT Assessment Patient needs continued PT services  PT Problem List Decreased strength;Decreased activity tolerance;Decreased balance;Decreased mobility;Decreased knowledge of use of DME;Decreased safety awareness       PT Treatment Interventions DME instruction;Gait training;Stair training;Functional mobility training;Therapeutic activities;Therapeutic exercise;Balance training;Neuromuscular re-education    PT Goals (Current goals can be found in the Care Plan section)  Acute Rehab PT Goals Patient Stated Goal: to go home today PT Goal Formulation: With patient Time For Goal Achievement: 05/02/19 Potential to Achieve Goals: Good    Frequency Min 5X/week   Barriers to discharge        Co-evaluation               AM-PAC PT "6 Clicks" Mobility  Outcome Measure Help needed turning from your back to your side while in a flat bed without using bedrails?: A Little Help needed moving from lying on your back to sitting on the side of a flat bed without using bedrails?: A Little Help needed moving to and from a bed to a chair (including a wheelchair)?: A Little Help needed standing up from a chair using your arms (e.g., wheelchair or bedside chair)?: A Little Help needed to walk in hospital room?: A Little Help needed climbing 3-5 steps with a railing? : A Little 6 Click Score: 18    End of Session  Equipment Utilized During Treatment: Gait belt;Back brace Activity Tolerance: Patient tolerated treatment well Patient left: in chair;with call bell/phone within reach Nurse Communication: Mobility status PT Visit Diagnosis: Unsteadiness on feet (R26.81);Difficulty in walking, not elsewhere classified (R26.2)    Time: 2778-2423 PT Time Calculation (min) (ACUTE ONLY): 17 min   Charges:   PT Evaluation $PT Eval Moderate Complexity: 1 Mod          Kittie Plater, PT, DPT Acute Rehabilitation Services Pager #: [phone] Office #: [phone]   Berline Lopes 04/18/2019, 1:13 PM

## 2019-04-18 NOTE — Progress Notes (Signed)
  NEUROSURGERY PROGRESS NOTE   No issues overnight.  Pre op symptoms resolved Mild back discomfort Does endorse mild lightheadedness when going from laying to sitting Tolerating po. Ambulating in hall. Voiding normal.  EXAM:  BP 116/72 (BP Location: Left Arm)   Pulse 84   Temp 98 F (36.7 C) (Oral)   Resp 18   Ht 5\' 10"  (1.778 m)   Wt 101.6 kg   LMP  (LMP Unknown)   SpO2 100%   BMI 32.14 kg/m   Awake, alert, oriented  Speech fluent, appropriate  CN grossly intact  5/5 BUE/BLE Incision: c/d/i, flat  PLAN Doing well.  No evidence of pseudomeningocele.  Mild lightheadedness when going from laying to sitting. Did note mild bump in Cr. Will give 500cc bolus. If symptoms resolve, okay for discharge.  Ferne Reus, PA-C [facility] [employer]

## 2019-04-18 NOTE — Plan of Care (Signed)
Patient alert and oriented, mae's well, voiding adequate amount of urine, swallowing without difficulty, no c/o pain at time of discharge. Patient discharged home with family. Script and discharged instructions given to patient. Patient and family stated understanding of instructions given. Patient has an appointment with Dr. Nundkumar  

## 2019-04-18 NOTE — Discharge Summary (Addendum)
Physician Discharge Summary  Patient ID: Pamela Mckinney MRN: [id number] DOB/AGE: Feb 10, 1946 73 y.o.  Admit date: 04/17/2019 Discharge date: 04/18/2019  Admission Diagnoses:  Lumbar spinal stenosis  Discharge Diagnoses:  Same Active Problems:   Lumbar spinal stenosis   Discharged Condition: Stable  Hospital Course:  Pamela Mckinney is a 73 y.o. female who was admitted for the below procedure. There were no post operative complications. At time of discharge, pain was well controlled, ambulating with Pt/OT, tolerating po, voiding normal. Ready for discharge.  Treatments: Surgery - L2-4 PLIF  Discharge Exam: Blood pressure 116/72, pulse 84, temperature 98 F (36.7 C), temperature source Oral, resp. rate 18, height 5\' 10"  (1.778 m), weight 101.6 kg, SpO2 100 %. Awake, alert, oriented Speech fluent, appropriate CN grossly intact 5/5 BUE/BLE Wound c/d/i, flat  Disposition: Discharge disposition: 01-Home or Self Care       Discharge Instructions    Call MD for:  difficulty breathing, headache or visual disturbances   Complete by: As directed    Call MD for:  persistant dizziness or light-headedness   Complete by: As directed    Call MD for:  redness, tenderness, or signs of infection (pain, swelling, redness, odor or green/yellow discharge around incision site)   Complete by: As directed    Call MD for:  severe uncontrolled pain   Complete by: As directed    Call MD for:  temperature >100.4   Complete by: As directed    Diet general   Complete by: As directed    Driving Restrictions   Complete by: As directed    Do not drive until given clearance.   Increase activity slowly   Complete by: As directed    Lifting restrictions   Complete by: As directed    Do not lift anything >10lbs. Avoid bending and twisting in awkward positions. Avoid bending at the back.   May shower / Bathe   Complete by: As directed    In 24 hours. Okay to wash wound with warm soapy water.  Avoid scrubbing the wound. Pat dry.   Remove dressing in 24 hours   Complete by: As directed      Allergies as of 04/18/2019      Reactions   Codeine Nausea And Vomiting, Rash, Other (See Comments)   Any pain medication      Medication List    TAKE these medications   hydrOXYzine 25 MG capsule Commonly known as: Vistaril Take 1 capsule (25 mg total) by mouth 3 (three) times daily as needed.   levothyroxine 137 MCG tablet Commonly known as: SYNTHROID Take 1 tablet (137 mcg total) by mouth daily before breakfast.   lisinopril 40 MG tablet Commonly known as: ZESTRIL Take 40 mg by mouth daily.   loratadine 10 MG tablet Commonly known as: CLARITIN Take 10 mg by mouth daily.   oxyCODONE-acetaminophen 7.5-325 MG tablet Commonly known as: Percocet Take 1 tablet by mouth every 4 (four) hours as needed.   pantoprazole 40 MG tablet Commonly known as: PROTONIX Take 40 mg by mouth daily.   simvastatin 20 MG tablet Commonly known as: ZOCOR Take 20 mg by mouth at bedtime.      Follow-up Information    Consuella Lose, MD. Schedule an appointment as soon as possible for a visit in 3 week(s).   Specialty: Neurosurgery Contact information: [address] [place] [address] [place] [address] [phone]           Signed: Traci Sermon  04/18/2019, 8:19 AM

## 2019-04-18 NOTE — Progress Notes (Signed)
Occupational Therapy Evaluation Patient Details Name: Pamela Mckinney MRN: [id number] DOB: 05-23-1946 Today's Date: 04/18/2019    History of Present Illness Pt s/p L2-L4 PLIF.   Clinical Impression   Pt admitted s/p above surgery. PTA she lives at home with husband and is independent with ADLs. Pt reports husband has some memory loss but can provide light assist as needed (example, socks and shoes). Pt also reports that her daughter is coming to stay with her for a week to provide initial 24/7 assist.  Pt completed UB/LB dressing with supervision for all tasks except min assist for LB dressing. Pt does have a reacher at home and therapist reviewed how to use it for LB ADLs. Also recommended she consider a long handled sponge for home. Therapist educated pt on back precautions and strategies for maintaining precautions during ADLs. Education completed. No further acute OT needs. Will sign off.     Follow Up Recommendations  No OT follow up;Supervision/Assistance - 24 hour(initally)    Equipment Recommendations  None recommended by OT    Recommendations for Other Services       Precautions / Restrictions Precautions Precautions: Back Precaution Comments: Reviewed 3/3 back precautions. Required Braces or Orthoses: Spinal Brace Spinal Brace: Lumbar corset;Applied in sitting position Restrictions Weight Bearing Restrictions: No      Mobility Bed Mobility Overal bed mobility: (pt sitting EOB)             General bed mobility comments: Pt able to verbalize log roll technique.  Transfers Overall transfer level: Needs assistance Equipment used: None Transfers: Sit to/from Stand Sit to Stand: Supervision              Balance                                           ADL either performed or assessed with clinical judgement   ADL Overall ADL's : Needs assistance/impaired Eating/Feeding: Independent;Sitting   Grooming: Supervision/safety;Standing    Upper Body Bathing: Supervision/ safety;Sitting   Lower Body Bathing: Supervison/ safety;Sit to/from stand   Upper Body Dressing : Supervision/safety;Sitting   Lower Body Dressing: Minimal assistance;Sit to/from stand   Toilet Transfer: Supervision/safety;Ambulation Toilet Transfer Details (indicate cue type and reason): simulated Toileting- Clothing Manipulation and Hygiene: Supervision/safety;Sit to/from stand       Functional mobility during ADLs: Supervision/safety General ADL Comments: Pt performed UB/LB dressing in prep for discharge later. She required min assist with donning pants (assist partly due to gripper socks).  Pt does have a reacher at home, so therapist educated her on use of reacher for LB ADLs. Also recommended used of long handled sponge to assist with adhering to back precautions during shower. Therapist discussed options/venues to purchase long handled sponge.      Vision         Perception     Praxis      Pertinent Vitals/Pain Pain Assessment: 0-10 Pain Score: 2  Pain Location: back Pain Descriptors / Indicators: Operative site guarding Pain Intervention(s): Limited activity within patient's tolerance;Monitored during session     Hand Dominance     Extremity/Trunk Assessment Upper Extremity Assessment Upper Extremity Assessment: Overall WFL for tasks assessed   Lower Extremity Assessment Lower Extremity Assessment: Defer to PT evaluation       Communication Communication Communication: No difficulties   Cognition Arousal/Alertness: Awake/alert Behavior During Therapy: [facility] for tasks assessed/performed  Overall Cognitive Status: Within Functional Limits for tasks assessed                                     General Comments       Exercises     Shoulder Instructions      Home Living Family/patient expects to be discharged to:: Private residence Living Arrangements: Spouse/significant other Available Help at  Discharge: Family;Available 24 hours/day Type of Home: House Home Access: Stairs to enter [employer] of Steps: 1   Home Layout: Able to live on main level with bedroom/bathroom     Bathroom Shower/Tub: Teacher, early years/pre: Handicapped height     Home Equipment: Tub bench;Adaptive equipment Adaptive Equipment: Reacher        Prior Functioning/Environment Level of Independence: Independent                 OT Problem List: Pain;Decreased knowledge of precautions;Decreased knowledge of use of DME or AE      OT Treatment/Interventions:      OT Goals(Current goals can be found in the care plan section) Acute Rehab OT Goals Patient Stated Goal: to go home today  OT Frequency:     Barriers to D/C:            Co-evaluation              AM-PAC OT "6 Clicks" Daily Activity     Outcome Measure Help from another person eating meals?: None Help from another person taking care of personal grooming?: None Help from another person toileting, which includes using toliet, bedpan, or urinal?: None Help from another person bathing (including washing, rinsing, drying)?: None Help from another person to put on and taking off regular upper body clothing?: None Help from another person to put on and taking off regular lower body clothing?: A Little 6 Click Score: 23   End of Session Equipment Utilized During Treatment: Back brace Nurse Communication: Mobility status  Activity Tolerance: Patient tolerated treatment well Patient left: with call bell/phone within reach(sitting EOB)  OT Visit Diagnosis: Pain Pain - part of body: (back)                Time: 8891-6945 OT Time Calculation (min): 16 min Charges:  OT General Charges $OT Visit: 1 Visit OT Evaluation $OT Eval Low Complexity: [place], [place] OTR/L [place] [phone] 04/18/2019, 11:33 AM

## 2019-04-18 NOTE — Care Management CC44 (Signed)
Condition Code 44 Documentation Completed  Patient Details  Name: Pamela Mckinney MRN: [id number] Date of Birth: 1946/07/13   Condition Code 44 given:  Yes Patient signature on Condition Code 44 notice:  Yes Documentation of 2 MD's agreement:  Yes Code 44 added to claim:  Yes    Pollie Friar, RN 04/18/2019, 10:19 AM

## 2019-04-18 NOTE — Care Management Obs Status (Signed)
[facility] NOTIFICATION   Patient Details  Name: DOROTHE ELMORE MRN: [id number] Date of Birth: [birth year]   Medicare Observation Status Notification Given:  Yes    Pollie Friar, RN 04/18/2019, 10:19 AM

## 2019-04-24 NOTE — Op Note (Signed)
NEUROSURGERY OPERATIVE NOTE   PREOP DIAGNOSIS:  1. Lumbar spinal stenosis with neurogenic claudication, L2-3, L3-4  POSTOP DIAGNOSIS: Same  PROCEDURE: 1. L2, L3 laminectomy with facetectomy for decompression of exiting nerve roots, more than would be required for placement of interbody graft 2. Placement of anterior interbody device - Medtronic Rise expandable cages x4 3. Posterior segmental instrumentation using cortical pedicle screws at L2 - L4 4. Interbody arthrodesis, L2-3, L3-4 5. Use of locally harvested bone autograft 6. Use of non-structural bone allograft - rhBMP-2  SURGEON: Dr. Consuella Lose, MD  ASSISTANT: Ferne Reus, PA-C  ANESTHESIA: General Endotracheal  EBL: 300cc  SPECIMENS: None  DRAINS: None  COMPLICATIONS: None immediate  CONDITION: Hemodynamically stable to PACU  HISTORY: Pamela Mckinney is a 73 y.o. female who has been followed in the outpatient clinic with back and leg pain consistent with neurogenic claudication related to multifactorial stenosis at L2-3 and L3-4. multiple conservative treatments were attempted without significant improvement and we ultimately elected to proceed with surgical decompression and fusion. Risks, benefits, and alternative treatments were reviewed in detail in the office. After all questions were answered, informed consent was obtained and witnessed.  PROCEDURE IN DETAIL: The patient was brought to the operating room via stretcher. After induction of general anesthesia, the patient was positioned on the operative table in the prone position. All pressure points were meticulously padded. Incision was then marked out and prepped and draped in the usual sterile fashion.  After timeout was conducted, skin was infiltrated with local anesthetic. Skin incision was then made sharply and Bovie electrocautery was used to dissect the subcutaneous tissue until the lumbodorsal fascia was identified and incised. The muscle was  then elevated in the subperiosteal plane and the L2 and L3 lamina and intervening L2-3 and L3-4 facet complexes were identified. Self-retaining retractors were then placed. Lateral fluoroscopy was taken with a dissector in the L2-3 interspace to confirm our location.  At this point attention was turned to decompression. Complete  L2 and L3 laminectomy was completed with a high-speed drill and Kerrison punches.  Normal dura was identified.  A ball-tipped dissector was then used to identify the foramina at L2-3 and L3-4 bilaterally.  High-speed drill was used to cut across the pars interarticularis bilaterally at L2 and L3, and the inferior articulating processes of L2 and L3 were removed bilaterally. The L2, L3, and L4 nerve roots were then identified.  I was then able to easily pass a ball dissector in the ventral epidural space and underneath these nerve roots indicating good decompression.  During decompression on the patient's left side just over the proximal portion of the left L4 nerve root medial to the left L4 pedicle I did note a small durotomy with arachnoid and some CSF egress.  This area was covered with a small half by half cottonoid and was addressed later during the procedure.  Initially, the L2-3 disc space was identified, incised bilaterally, and using a combination of shavers, curettes and rongeurs, complete discectomy was completed. Endplates were prepared with curettes and rasps.  In a similar fashion, the L3-4 disc space was identified, incised bilaterally, and discectomy completed.  Again, the L3 and L4 endplates were prepared with curettes and rasps. Bone harvested during decompression was mixed with BMP and packed into the interspace.  Expandable cages were then selected and tapped into the L2-3 space bilaterally.  These were expanded maximally under fluoroscopic guidance.  In a similar fashion, expandable cages were selected and tapped into place  at the L3-4 disc space bilaterally.   Again, these cages were maximally expanded with lateral fluoroscopy.  At this point, prior to placement of the cortical pedicle screws, the half a half cottonoid overlying the durotomy was removed.  There is a small hole noted in the dura.  This was amenable to placement of 2 interrupted 4 Nurolon stitches.  Once these were placed, no further CSF egress was identified.  At this point, the entry points for bilateral L2, L3, and L4 cortical pedicle screws were identified using standard anatomic landmarks and lateral fluoro. Pilot holes were then drilled and tapped to 5.5 x 35 mm. Screws were then placed in L2, L3, and L4. Prebent lordotic rod was then sized and placed into the pedicle screws. Set screws were placed and final tightened. Final AP and lateral fluoroscopic images confirmed good position.  At this point, the dura was covered with a layer of polyethylene glycol sealant.  Also all the maneuver was performed, and no CSF egress was seen.  Hemostasis was secured and confirmed with bipolar cautery and morcellized gelfoam with thrombin. The wound was then irrigated with copious amounts of antibiotic saline, then closed in standard fashion using a combination of interrupted 0 and 3-0 Vicryl stitches in the muscular, fascial, and subcutaneous layers. Skin was then closed using standard Dermabond. Sterile dressing was then applied. The patient was then transferred to the stretcher, extubated, and taken to the postanesthesia care unit in stable hemodynamic condition.  At the end of the case all sponge, needle, cottonoid, and instrument counts were correct.

## 2019-08-27 ENCOUNTER — Encounter: Payer: Self-pay | Admitting: *Deleted

## 2019-08-28 ENCOUNTER — Ambulatory Visit (INDEPENDENT_AMBULATORY_CARE_PROVIDER_SITE_OTHER): Payer: Medicare Other | Admitting: Physician Assistant

## 2019-08-28 ENCOUNTER — Encounter: Payer: Self-pay | Admitting: Physician Assistant

## 2019-08-28 VITALS — BP 144/86 | HR 78 | Temp 98.2°F | Ht 70.0 in | Wt 218.0 lb

## 2019-08-28 DIAGNOSIS — R197 Diarrhea, unspecified: Secondary | ICD-10-CM | POA: Diagnosis not present

## 2019-08-28 MED ORDER — CHOLESTYRAMINE 4 G PO PACK: 4.0000 g | PACK | Freq: Every day | ORAL | 8 refills | Status: DC

## 2019-08-28 NOTE — Patient Instructions (Addendum)
We have sent the following medications to your pharmacy for you to pick up at your convenience: Questran 4 gram pack- Take 1 packet once daily in water/juice at least 2 hours apart from all other medications  Call back and ask to speak to Dr Doyne Keel nurse in 7-10 days with progress report.  If you are age 75 or older, your body mass index should be between 23-30. Your Body mass index is 31.28 kg/m. If this is out of the aforementioned range listed, please consider follow up with your Primary Care Provider.  If you are age 51 or younger, your body mass index should be between 19-25. Your Body mass index is 31.28 kg/m. If this is out of the aformentioned range listed, please consider follow up with your Primary Care Provider.

## 2019-08-29 ENCOUNTER — Encounter: Payer: Self-pay | Admitting: Physician Assistant

## 2019-08-29 NOTE — Progress Notes (Signed)
Agree with assessment and plan as outlined.  

## 2019-08-29 NOTE — Progress Notes (Signed)
Subjective:    Patient ID: Pamela Mckinney, female    DOB: December 28, 1945, 73 y.o.   MRN: [id number]  HPI Pamela Mckinney is a pleasant 73 year old white female, known to Dr. Havery Moros who comes in today with complaints of diarrhea.  She was last seen in January 2019 at which time she had colonoscopy for follow-up of adenomatous polyps.  She was found to have 3 sessile polyps in the ascending colon sigmoid diverticulosis and a tortuous colon.  Biopsies were consistent with tubular adenomas and she is indicated for 3-year interval follow-up.  She also relates family history of colon cancer. Patient says she has had problems with diarrhea ever since she had her gallbladder out several years ago.  She has had somewhat worsening of symptoms this year.  She says symptoms are present most days and at times she will have multiple bowel movements per day and is afraid to leave the house.  She will on occasion have a day or 2 without a bowel movement and then generally goes back to the diarrhea pattern.  She is generally having some abdominal cramping associated.  Appetite has been fine, weight has been stable.  She has not noted any melena or hematochezia.  She has used Pepto-Bismol on occasion which is beneficial , has  probiotics without any benefit. Bowel movements are sometimes urgent after meals.  She says she had 5 bowel movements yesterday generally all loose. She also mentions that she has been under a lot of stress over the past few months, her husband has been diagnosed with dementia and she is his primary caregiver.  Review of Systems Pertinent positive and negative review of systems were noted in the above HPI section.  All other review of systems was otherwise negative.  Outpatient Encounter Medications as of 08/28/2019  Medication Sig  . Elderberry 575 MG/5ML SYRP Take by mouth daily.  Marland Kitchen levothyroxine (SYNTHROID, LEVOTHROID) 137 MCG tablet Take 1 tablet (137 mcg total) by mouth daily before breakfast.  .  lisinopril (PRINIVIL,ZESTRIL) 40 MG tablet Take 40 mg by mouth daily.  Marland Kitchen loratadine (CLARITIN) 10 MG tablet Take 10 mg by mouth daily.  . Multiple Vitamins-Iron (ONE-DAILY MULTI-VITAMIN/IRON PO) Take by mouth daily.  . pantoprazole (PROTONIX) 40 MG tablet Take 40 mg by mouth daily.  . simvastatin (ZOCOR) 20 MG tablet Take 20 mg by mouth at bedtime.   . cholestyramine (QUESTRAN) 4 g packet Take 1 packet (4 g total) by mouth daily. Take 2 hours apart from all other medications  . [DISCONTINUED] hydrOXYzine (VISTARIL) 25 MG capsule Take 1 capsule (25 mg total) by mouth 3 (three) times daily as needed.  . [DISCONTINUED] oxyCODONE-acetaminophen (PERCOCET) 7.5-325 MG tablet Take 1 tablet by mouth every 4 (four) hours as needed.  . [DISCONTINUED] 0.9 %  sodium chloride infusion    No facility-administered encounter medications on file as of 08/28/2019.    Allergies  Allergen Reactions  . Codeine Nausea And Vomiting, Rash and Other (See Comments)    Any pain medication   Patient Active Problem List   Diagnosis Date Noted  . Lumbar spinal stenosis 04/17/2019  . 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 Marital status: Married    Spouse name: Not on file  . Number of children: 2  . Years of education: Not on file  . Highest education level: Not on file  Occupational History  . Not on file  Social Needs  . Financial resource strain: Not on  file  . Food insecurity    Worry: Not on file    Inability: Not on file  . Transportation needs    Medical: Not on file    Non-medical: Not on file  Tobacco Use  . Smoking status: Never Smoker  . Smokeless tobacco: Never Used  Substance and Sexual Activity  . Alcohol use: Yes    Comment: occ wine  . Drug use: No  . Sexual activity: Not on file  Lifestyle  . Physical activity    Days per week: Not on file    Minutes per session: Not on file  . Stress: Not on file  Relationships  . Social Product manager on phone: Not on file    Gets together: Not on file    Attends religious service: Not on file    Active member of club or organization: Not on file    Attends meetings of clubs or organizations: Not on file    Relationship status: Not on file  . Intimate partner violence    Fear of current or ex partner: Not on file    Emotionally abused: Not on file    Physically abused: Not on file    Forced sexual activity: Not on file  Other Topics Concern  . Not on file  Social History Narrative  . Not on file    Ms. Pett's family history includes Allergies in her mother; Cancer in her father and maternal grandfather; Clotting disorder in her mother; Colon cancer in her maternal grandfather; Stroke in her maternal grandmother.      Objective:    Vitals:   08/28/19 1531  BP: (!) 144/86  Pulse: 78  Temp: 98.2 F (36.8 C)    Physical Exam Well-developed well-nourished older white female in no acute distress.  Height, Weight, 218 BMI 31.2  HEENT; nontraumatic normocephalic, EOMI, PER R LA, sclera anicteric. Oropharynx; not examined/mask/Covid Neck; supple, no JVD Cardiovascular; regular rate and rhythm with S1-S2, no murmur rub or gallop Pulmonary; Clear bilaterally Abdomen; soft, nontender, nondistended, no palpable mass or hepatosplenomegaly, bowel sounds are active Rectal; not done Skin; benign exam, no jaundice rash or appreciable lesions Extremities; no clubbing cyanosis or edema skin warm and dry Neuro/Psych; alert and oriented x4, grossly nonfocal mood and affect appropriate       Assessment & Plan:   #39 73 year old white female with chronic diarrhea, onset post cholecystectomy several years ago. She has not been treated for the diarrhea in the past.  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 Also consider IBS-D with worsening this year due to stress  #2 history of adenomatous colon polyps-up-to-date with colonoscopy last done January 2019 and due for 3-year  interval follow-up #3 family history of colon cancer   #4 diverticulosis   Plan; will start trial of Questran 4 g once daily at least 2 hours away from any meds.  If she finds this to be constipating can decrease to one half dose daily. If Lucrezia Starch is not helpful she is asked to call back, and at that point would give her a trial of an antispasmodic, and consider treating for IBS-D with a course of Xifaxan. She will follow-up in the office with Dr. Havery Moros or myself as needed. Plan   Pamela Taylor Genia Harold PA-C 08/29/2019   Cc: Pamela Rad, MD

## 2019-09-04 LAB — TSH: TSH: 0.71 (ref 0.41–5.90)

## 2019-09-11 ENCOUNTER — Other Ambulatory Visit: Payer: Self-pay

## 2019-09-11 ENCOUNTER — Encounter: Payer: Self-pay | Admitting: "Endocrinology

## 2019-09-11 ENCOUNTER — Ambulatory Visit (INDEPENDENT_AMBULATORY_CARE_PROVIDER_SITE_OTHER): Payer: Medicare Other | Admitting: "Endocrinology

## 2019-09-11 DIAGNOSIS — E890 Postprocedural hypothyroidism: Secondary | ICD-10-CM | POA: Diagnosis not present

## 2019-09-11 MED ORDER — LEVOTHYROXINE SODIUM 125 MCG PO TABS: 125.0000 ug | ORAL_TABLET | Freq: Every day | ORAL | 2 refills | Status: DC

## 2019-09-11 NOTE — Progress Notes (Signed)
09/11/2019                                Endocrinology Telehealth Visit Follow up Note -During COVID -19 Pandemic  I connected with Pamela Mckinney on 09/11/2019   by telephone and verified that I am speaking with the correct person using two identifiers. Pamela Mckinney, 1945/11/20. she has verbally consented to this visit. All issues noted in this document were discussed and addressed. The format was not optimal for physical exam.    Subjective:    Patient ID: Pamela Mckinney, female    DOB: [birth year],    Past Medical History:  Diagnosis Date  . Allergy   . Diverticulosis   . GERD (gastroesophageal reflux disease)   . History of hiatal hernia   . Hyperlipidemia   . Hypertension   . Hypothyroidism   . Osteoarthritis   . Post-operative nausea and vomiting   . SVD (spontaneous vaginal delivery)    x 2  . Tubular adenoma of colon    Past Surgical History:  Procedure Laterality Date  . ABLATION SAPHENOUS VEIN W/ RFA    . APPENDECTOMY    . CERVICAL FUSION    . CERVICAL LAMINECTOMY    . CHOLECYSTECTOMY    . COLONOSCOPY  10/2018   polyps  . JOINT REPLACEMENT Bilateral 2019  . LUMBAR FUSION  03/2019  . THYROID SURGERY    . TONSILLECTOMY    . TOTAL KNEE ARTHROPLASTY Right 08/2016  . UPPER GI ENDOSCOPY     Barrett's - no longer an issue per patient 04/13/19  . WISDOM TOOTH EXTRACTION     Social History   Socioeconomic History  . Marital status: Married    Spouse name: Not on file  . Number of children: 2  . Years of education: Not on file  . Highest education level: Not on file  Occupational History  . Not on file  Social Needs  . Financial resource strain: Not on file  . Food insecurity    Worry: Not on file    Inability: Not on file  . Transportation needs    Medical: Not on file    Non-medical: Not on file  Tobacco Use  . Smoking status: Never Smoker  . Smokeless tobacco: Never Used  Substance and Sexual Activity  . Alcohol use: Yes    Comment: occ wine   . Drug use: No  . Sexual activity: Not on file  Lifestyle  . Physical activity    Days per week: Not on file    Minutes per session: Not on file  . Stress: Not on file  Relationships  . Social Herbalist on phone: Not on file    Gets together: Not on file    Attends religious service: Not on file    Active member of club or organization: Not on file    Attends meetings of clubs or organizations: Not on file    Relationship status: Not on file  Other Topics Concern  . Not on file  Social History Narrative  . Not on file   Outpatient Encounter Medications as of 09/11/2019  Medication Sig  . cholestyramine (QUESTRAN) 4 g packet Take 1 packet (4 g total) by mouth daily. Take 2 hours apart from all other medications  . Elderberry 575 MG/5ML SYRP Take by mouth daily.  Marland Kitchen levothyroxine (SYNTHROID) 125 MCG tablet Take 1 tablet (  125 mcg total) by mouth daily before breakfast.  . lisinopril (PRINIVIL,ZESTRIL) 40 MG tablet Take 40 mg by mouth daily.  Marland Kitchen loratadine (CLARITIN) 10 MG tablet Take 10 mg by mouth daily.  . Multiple Vitamins-Iron (ONE-DAILY MULTI-VITAMIN/IRON PO) Take by mouth daily.  . pantoprazole (PROTONIX) 40 MG tablet Take 40 mg by mouth daily.  . simvastatin (ZOCOR) 20 MG tablet Take 20 mg by mouth at bedtime.   . [DISCONTINUED] levothyroxine (SYNTHROID, LEVOTHROID) 137 MCG tablet Take 1 tablet (137 mcg total) by mouth daily before breakfast.   No facility-administered encounter medications on file as of 09/11/2019.    ALLERGIES: Allergies  Allergen Reactions  . Codeine Nausea And Vomiting, Rash and Other (See Comments)    Any pain medication   VACCINATION STATUS:  There is no immunization history on file for this patient.  HPI  Mrs. Hankerson is a 73 year old female with medical history as above.  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    she reports history of left  partial thyroidectomy at approximate age of 57 years due to   "cold nodule". she has taken thyroid hormone replacement at various doses over the years currently at 137 mcg. she is compliant. she reports hot flashes, night sweats.  She denies  palpitations, tremors.  She denies weight loss.  she denies dysphagia,  shortness of breath.    Review of Systems Limited as above.  Objective:    LMP  (LMP Unknown)   Wt Readings from Last 3 Encounters:  08/28/19 218 lb (98.9 kg)  04/17/19 224 lb (101.6 kg)  04/13/19 224 lb 11.2 oz (101.9 kg)    Physical Exam    August 28, 2018 labs: Free T4 1.4 4, TSH 4.26 Mar 06, 2018: Free T4 1.7 to elevated, TSH 0.38 On 08/28/2015 her free T4 was slightly elevated at 1.62 (normal 0.76-1.46), TSH 1.7.  January 29, 2016 thyroid function tests showed: TSH 3.68 and free T4 1.42. 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   Recent Results (from the past 2160 hour(s))  TSH     Status: None   Collection Time: 09/04/19 12:00 AM  Result Value Ref Range   TSH 0.71 0.41 - 5.90    Comment: free t4 1.74    Assessment & Plan:   1. Postsurgical hypothyroidism  I have reviewed her available records. she has established longstanding postsurgical hypothyroidism at approximate age of 34 years  after left hemithyroidectomy for cold nodule.  Her previsit labs suggest over replacement.  She is approached for a lower dose of levothyroxine.  I discussed and lowered her levothyroxine to 125 mcg p.o. daily before breakfast.   - We discussed about the correct intake of her thyroid hormone, on empty stomach at fasting, with water, separated by at least 30 minutes from breakfast and other medications,  and separated by more than 4 hours from calcium, iron, multivitamins, acid reflux medications (PPIs). -Patient is made aware of the fact that thyroid hormone replacement is  needed for life, dose to be adjusted by periodic monitoring of thyroid function tests.  Her thyroid ultrasound is unremarkable for any further nodular lesions. See above. No intervention required at this time.  I advised patient to maintain close follow up with her PCP for primary care needs. Follow up plan: Return in  about 3 months (around 12/12/2019) for Follow up with Pre-visit Labs.  Glade Lloyd, MD Phone: [phone]  Fax: [phone]  -  This note was partially dictated with voice recognition software. Similar sounding words can be transcribed inadequately or may not  be corrected upon review.  09/11/2019, 4:39 PM

## 2019-10-03 ENCOUNTER — Telehealth: Payer: Self-pay | Admitting: Physician Assistant

## 2019-10-03 NOTE — Telephone Encounter (Signed)
Patient reports improvement but still not where she wants to be with her symptoms. She was taking Questran 1/2 packet daily. Increased it to the entire packet a week ago. Less diarrhea. She starts her day with low abdominal discomfort feeling bloated and cramping when she wakes up. She then has a bowel movement and feels better for a while. Then she will get cramps again and that's when she has diarrhea. She continues to be "gassy and bloated." Please advise.

## 2019-10-04 ENCOUNTER — Other Ambulatory Visit: Payer: Self-pay

## 2019-10-04 MED ORDER — HYOSCYAMINE SULFATE 0.125 MG SL SUBL: SUBLINGUAL_TABLET | SUBLINGUAL | 4 refills | Status: DC

## 2019-10-04 NOTE — Telephone Encounter (Signed)
Instructed the patient. She agrees to this plan of care. Confirmed the pharmacy. Call us if she acutely worsens or fails to improve. States Lucrezia Starch is expensive. Will make small changes and go forward with addressing the medications as she improves.

## 2019-10-04 NOTE — Telephone Encounter (Signed)
Please ask her to continue Questran one whole pack daily and lets add trial of levsin sl one BID -take one in early am and one about 6 hours later  # 70 /4

## 2019-12-05 LAB — TSH: TSH: 3.53 (ref 0.41–5.90)

## 2019-12-06 ENCOUNTER — Other Ambulatory Visit: Payer: Self-pay | Admitting: "Endocrinology

## 2019-12-13 ENCOUNTER — Encounter: Payer: Self-pay | Admitting: "Endocrinology

## 2019-12-13 ENCOUNTER — Ambulatory Visit (INDEPENDENT_AMBULATORY_CARE_PROVIDER_SITE_OTHER): Payer: Medicare Other | Admitting: "Endocrinology

## 2019-12-13 DIAGNOSIS — E890 Postprocedural hypothyroidism: Secondary | ICD-10-CM

## 2019-12-13 MED ORDER — LEVOTHYROXINE SODIUM 125 MCG PO TABS: 125.0000 ug | ORAL_TABLET | Freq: Every day | ORAL | 1 refills | Status: DC

## 2019-12-13 NOTE — Progress Notes (Signed)
12/13/2019                                Endocrinology Telehealth Visit Follow up Note -During COVID -19 Pandemic  I connected with Pamela Mckinney on 12/13/2019   by telephone and verified that I am speaking with the correct person using two identifiers. Pamela Mckinney, 1945-10-23. she has verbally consented to this visit. All issues noted in this document were discussed and addressed. The format was not optimal for physical exam.    Subjective:    Patient ID: Pamela Mckinney, female    DOB: 1946/02/18,    Past Medical History:  Diagnosis Date  . Allergy   . Diverticulosis   . GERD (gastroesophageal reflux disease)   . History of hiatal hernia   . Hyperlipidemia   . Hypertension   . Hypothyroidism   . Osteoarthritis   . Post-operative nausea and vomiting   . SVD (spontaneous vaginal delivery)    x 2  . Tubular adenoma of colon    Past Surgical History:  Procedure Laterality Date  . ABLATION SAPHENOUS VEIN W/ RFA    . APPENDECTOMY    . CERVICAL FUSION    . CERVICAL LAMINECTOMY    . CHOLECYSTECTOMY    . COLONOSCOPY  10/2018   polyps  . JOINT REPLACEMENT Bilateral 2019  . LUMBAR FUSION  03/2019  . THYROID SURGERY    . TONSILLECTOMY    . TOTAL KNEE ARTHROPLASTY Right 08/2016  . UPPER GI ENDOSCOPY     Barrett's - no longer an issue per patient 04/13/19  . WISDOM TOOTH EXTRACTION     Social History   Socioeconomic History  . Marital status: Married    Spouse name: Not on file  . Number of children: 2  . Years of education: Not on file  . Highest education level: Not on file  Occupational History  . Not on file  Tobacco Use  . Smoking status: Never Smoker  . Smokeless tobacco: Never Used  Substance and Sexual Activity  . Alcohol use: Yes    Comment: occ wine  . Drug use: No  . Sexual activity: Not on file  Other Topics Concern  . Not on file  Social History Narrative  . Not on file   Social Determinants of Health   Financial Resource Strain:   .  Difficulty of Paying Living Expenses: Not on file  Food Insecurity:   . Worried About Charity fundraiser in the Last Year: Not on file  . Ran Out of Food in the Last Year: Not on file  Transportation Needs:   . Lack of Transportation (Medical): Not on file  . Lack of Transportation (Non-Medical): Not on file  Physical Activity:   . Days of Exercise per Week: Not on file  . Minutes of Exercise per Session: Not on file  Stress:   . Feeling of Stress : Not on file  Social Connections:   . Frequency of Communication with Friends and Family: Not on file  . Frequency of Social Gatherings with Friends and Family: Not on file  . Attends Religious Services: Not on file  . Active Member of Clubs or Organizations: Not on file  . Attends Archivist Meetings: Not on file  . Marital Status: Not on file   Outpatient Encounter Medications as of 12/13/2019  Medication Sig  . cholestyramine (QUESTRAN) 4 g packet Take  1 packet (4 g total) by mouth daily. Take 2 hours apart from all other medications  . Elderberry 575 MG/5ML SYRP Take by mouth daily.  . hyoscyamine (LEVSIN SL) 0.125 MG SL tablet One tablet early AM and repeat in 6 hours  . levothyroxine (SYNTHROID) 125 MCG tablet Take 1 tablet (125 mcg total) by mouth daily before breakfast.  . lisinopril (PRINIVIL,ZESTRIL) 40 MG tablet Take 40 mg by mouth daily.  Marland Kitchen loratadine (CLARITIN) 10 MG tablet Take 10 mg by mouth daily.  . Multiple Vitamins-Iron (ONE-DAILY MULTI-VITAMIN/IRON PO) Take by mouth daily.  . pantoprazole (PROTONIX) 40 MG tablet Take 40 mg by mouth daily.  . simvastatin (ZOCOR) 20 MG tablet Take 20 mg by mouth at bedtime.   . [DISCONTINUED] levothyroxine (SYNTHROID) 125 MCG tablet TAKE 1 TABLET (125 MCG TOTAL) BY MOUTH DAILY BEFORE BREAKFAST.   No facility-administered encounter medications on file as of 12/13/2019.   ALLERGIES: Allergies  Allergen Reactions  . Codeine Nausea And Vomiting, Rash and Other (See Comments)     Any pain medication   VACCINATION STATUS:  There is no immunization history on file for this patient.  HPI  Pamela Mckinney is a 74 year old female with medical history as above.  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    she reports history of left  partial thyroidectomy at approximate age of 59 years due to  "cold nodule". she has taken thyroid hormone replacement at various doses over the years currently at 125 mcg. she is compliant. she reports improvement in her previous symptoms of hot flashes, night sweats.  She denies  palpitations, tremors.  She lost approximately 5 pounds, intentionally.     she denies dysphagia,  shortness of breath.    Review of Systems Limited as above.  Objective:    LMP  (LMP Unknown)   Wt Readings from Last 3 Encounters:  08/28/19 218 lb (98.9 kg)  04/17/19 224 lb (101.6 kg)  04/13/19 224 lb 11.2 oz (101.9 kg)    Physical Exam    Labs from 09/02/2015 showed free T4 1.26, TSH elevated at 3.82 Thyroid ultrasound showed surgically absent left lobe, 2.2 x 0.7 x 1 cm right lobe with no nodularity, 3.9 mm isthmus.  Recent Results (from the past 2160 hour(s))  TSH     Status: None   Collection Time: 12/05/19 12:00 AM  Result Value Ref Range   TSH 3.53 0.41 - 5.90    Comment:  free t4 1.5     Assessment & Plan:   1. Postsurgical hypothyroidism 2.  Multinodular goiter-resolved status post thyroidectomy  I have reviewed her available records. she has established longstanding postsurgical hypothyroidism at approximate age of 72 years  after left hemithyroidectomy for cold nodule.  Her previsit labs suggest appropriate replacement.  She is advised to continue her current dose of levothyroxine 125 mcg p.o. daily before breakfast.    - We discussed about the correct intake of her thyroid hormone, on empty stomach at fasting, with water, separated by at least 30 minutes from breakfast and other medications,  and separated by  more than 4 hours from calcium, iron, multivitamins, acid reflux medications (PPIs). -Patient is made aware of the fact that thyroid hormone replacement is needed for life, dose to be adjusted by periodic monitoring of thyroid function tests.  Her recent surveillance thyroid ultrasound is unremarkable for any further nodular lesions. See above. No intervention required at this time.  I advised patient to maintain close follow  up with her PCP for primary care needs.     - Time spent on this patient care encounter:  20 minutes of which 50% was spent in  counseling and the rest reviewing  her current and  previous labs / studies and medications  doses and developing a plan for long term care. Pamela Mckinney  participated in the discussions, expressed understanding, and voiced agreement with the above plans.  All questions were answered to her satisfaction. she is encouraged to contact clinic should she have any questions or concerns prior to her return visit.  Follow up plan: Return in about 6 months (around 06/11/2020) for Follow up with Pre-visit Labs.  Glade Lloyd, MD Phone: [phone]  Fax: [phone]  -  This note was partially dictated with voice recognition software. Similar sounding words can be transcribed inadequately or may not  be corrected upon review.  12/13/2019, 5:54 PM

## 2020-01-11 ENCOUNTER — Ambulatory Visit (INDEPENDENT_AMBULATORY_CARE_PROVIDER_SITE_OTHER): Payer: Medicare Other | Admitting: Physician Assistant

## 2020-01-11 ENCOUNTER — Encounter: Payer: Self-pay | Admitting: Physician Assistant

## 2020-01-11 VITALS — BP 120/80 | HR 64 | Temp 97.8°F | Ht 70.0 in | Wt 219.0 lb

## 2020-01-11 DIAGNOSIS — K589 Irritable bowel syndrome without diarrhea: Secondary | ICD-10-CM | POA: Diagnosis not present

## 2020-01-11 DIAGNOSIS — K9089 Other intestinal malabsorption: Secondary | ICD-10-CM | POA: Diagnosis not present

## 2020-01-11 DIAGNOSIS — K529 Noninfective gastroenteritis and colitis, unspecified: Secondary | ICD-10-CM

## 2020-01-11 MED ORDER — DICYCLOMINE HCL 10 MG PO CAPS: 10.0000 mg | ORAL_CAPSULE | Freq: Two times a day (BID) | ORAL | 10 refills | Status: DC

## 2020-01-11 MED ORDER — CHOLESTYRAMINE 4 G PO PACK: 4.0000 g | PACK | Freq: Every day | ORAL | 11 refills | Status: DC

## 2020-01-11 NOTE — Progress Notes (Signed)
Agree with assessment and plan as outlined.  

## 2020-01-11 NOTE — Patient Instructions (Signed)
We have sent the following medications to your pharmacy for you to pick up at your convenience:  Lucrezia Starch, Bentyl  Call back and talk to Amy's nurse Eustaquio Maize is this is not helping

## 2020-01-11 NOTE — Progress Notes (Signed)
Subjective:    Patient ID: Pamela Mckinney, female    DOB: 18-Jan-1946, 74 y.o.   MRN: [id number]  HPI Pamela Mckinney is a pleasant 74 year old white female, established with Dr. Havery Mckinney and also known to myself.  She was last seen in November 2020.  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 Also with history of adenomatous colon polyps, up-to-date with colonoscopy last done January 2019. At time of last office visit she was darted on a trial of Questran 4 g once daily. She says that has been helpful.  Apparently she tried generic Questran which was less expensive but that caused problems with gas and bloating- so  she has been, back on brand Questran once daily.  She says she has good days and bad days and may go for a couple of days with fairly normal bowel movements which are always soft alternating with a couple of days of diarrhea.  On these days she will usually have 4-5 bowel movements most of which occurs early in the morning.  She denies any abdominal pain, no melena or hematochezia.  She has been under a tremendous amount of stress caring for her husband who has Alzheimer's.  She says he is very irritable and she does not get much rest.  Review of Systems Pertinent positive and negative review of systems were noted in the above HPI section.  All other review of systems was otherwise negative.  Outpatient Encounter Medications as of 01/11/2020  Medication Sig  . amLODipine (NORVASC) 5 MG tablet Take 5 mg by mouth daily.  Marland Kitchen atenolol (TENORMIN) 25 MG tablet Take 25 mg by mouth daily.  . cholestyramine (QUESTRAN) 4 g packet Take 1 packet (4 g total) by mouth daily. Take 2 hours apart from all other medications  . Elderberry 575 MG/5ML SYRP Take by mouth daily.  . hyoscyamine (LEVSIN SL) 0.125 MG SL tablet One tablet early AM and repeat in 6 hours  . levothyroxine (SYNTHROID) 125 MCG tablet Take 1 tablet (125 mcg total) by mouth daily  before breakfast.  . lisinopril (PRINIVIL,ZESTRIL) 40 MG tablet Take 40 mg by mouth daily.  Marland Kitchen loratadine (CLARITIN) 10 MG tablet Take 10 mg by mouth daily.  . Multiple Vitamins-Iron (ONE-DAILY MULTI-VITAMIN/IRON PO) Take by mouth daily.  . pantoprazole (PROTONIX) 40 MG tablet Take 40 mg by mouth daily.  . sertraline (ZOLOFT) 50 MG tablet Take 50 mg by mouth daily.  . simvastatin (ZOCOR) 20 MG tablet Take 20 mg by mouth at bedtime.   . [DISCONTINUED] cholestyramine (QUESTRAN) 4 g packet Take 1 packet (4 g total) by mouth daily. Take 2 hours apart from all other medications  . dicyclomine (BENTYL) 10 MG capsule Take 1 capsule (10 mg total) by mouth in the morning and at bedtime.   No facility-administered encounter medications on file as of 01/11/2020.   Allergies  Allergen Reactions  . Codeine Nausea And Vomiting, Rash and Other (See Comments)    Any pain medication   Patient Active Problem List   Diagnosis Date Noted  . Lumbar spinal stenosis 04/17/2019  . 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 Marital status: Married    Spouse name: Not on file  . Number of children: 2  . Years of education: Not on file  . Highest education level: Not on file  Occupational History  . Occupation: caregiver  Tobacco Use  . Smoking status: Never  Smoker  . Smokeless tobacco: Never Used  Substance and Sexual Activity  . Alcohol use: Yes    Comment: occ wine  . Drug use: No  . Sexual activity: Not on file  Other Topics Concern  . Not on file  Social History Narrative  . Not on file   Social Determinants of Health   Financial Resource Strain:   . Difficulty of Paying Living Expenses:   Food Insecurity:   . Worried About Charity fundraiser in the Last Year:   . Arboriculturist in the Last Year:   Transportation Needs:   . Film/video editor (Medical):   Marland Kitchen Lack of Transportation (Non-Medical):   Physical Activity:   . Days of Exercise per  Week:   . Minutes of Exercise per Session:   Stress:   . Feeling of Stress :   Social Connections:   . Frequency of Communication with Friends and Family:   . Frequency of Social Gatherings with Friends and Family:   . Attends Religious Services:   . Active Member of Clubs or Organizations:   . Attends Archivist Meetings:   Marland Kitchen Marital Status:   Intimate Partner Violence:   . Fear of Current or Ex-Partner:   . Emotionally Abused:   Marland Kitchen Physically Abused:   . Sexually Abused:     Ms. Pamela Mckinney family history includes Allergies in her mother; Cancer in her father and maternal grandfather; Clotting disorder in her mother; Colon cancer in her maternal grandfather; Stroke in her maternal grandmother.      Objective:    Vitals:   01/11/20 0928  BP: 120/80  Pulse: 64  Temp: 97.8 F (36.6 C)    Physical Exam Well-developed well-nourished elderly white female in no acute distress.  Height, Weight,219  BMI31.4  HEENT; nontraumatic normocephalic, EOMI, PER R LA, sclera anicteric. 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 She has been having intermittent episodes of diarrhea occurring every couple of days with up to 4-5 bowel movements.  This alternates with days of normal bowel movements. I think she has a component of  IBS, and has been under a lot of stress.  #2 history of adenomatous colon polyps-last colonoscopy 2019-indicated for 3-year interval follow-up. 3.  Family history of colon cancer 4.  Diverticulosis  Plan; continue Questran 4 g daily in 6 to 8 ounces of water or juice. Add trial of Bentyl 10 mg p.o. twice daily. I have asked her to call back in a week or so if Bentyl is not effective, we can offer a trial of another antispasmodic, and/or increase Questran to twice daily.  Daanya Lanphier Genia Harold PA-C 01/11/2020   Cc: Kennieth Rad, MD

## 2020-03-09 ENCOUNTER — Other Ambulatory Visit: Payer: Self-pay | Admitting: "Endocrinology

## 2020-06-02 LAB — TSH: TSH: 2.2 (ref 0.41–5.90)

## 2020-06-12 ENCOUNTER — Telehealth (INDEPENDENT_AMBULATORY_CARE_PROVIDER_SITE_OTHER): Payer: Medicare Other | Admitting: Nurse Practitioner

## 2020-06-12 ENCOUNTER — Other Ambulatory Visit: Payer: Self-pay

## 2020-06-12 ENCOUNTER — Encounter: Payer: Self-pay | Admitting: Nurse Practitioner

## 2020-06-12 VITALS — Ht 70.0 in | Wt 220.0 lb

## 2020-06-12 DIAGNOSIS — E890 Postprocedural hypothyroidism: Secondary | ICD-10-CM | POA: Diagnosis not present

## 2020-06-12 NOTE — Patient Instructions (Signed)
-   The correct intake of thyroid hormone (Levothyroxine, Synthroid), is on empty stomach first thing in the morning, with water, separated by at least 30 minutes from breakfast and other medications,  and separated by more than 4 hours from calcium, iron, multivitamins, acid reflux medications (PPIs). ° °- This medication is a life-long medication and will be needed to correct thyroid hormone imbalances for the rest of your life.  The dose may change from time to time, based on thyroid blood work. ° °- It is extremely important to be consistent taking this medication, near the same time each morning. °

## 2020-06-12 NOTE — Progress Notes (Signed)
06/12/2020     Endocrinology Follow Up Visit   TELEHEALTH VISIT: The patient is being engaged in telehealth visit due to COVID-19.  This type of visit limits physical examination significantly, and thus is not preferable over face-to-face encounters.  I connected with  Pamela Mckinney on 06/12/20 by a video enabled telemedicine application and verified that I am speaking with the correct person using two identifiers.   I discussed the limitations of evaluation and management by telemedicine. The patient expressed understanding and agreed to proceed.    The participants involved in this visit include: Brita Romp, NP located at [facility] and Pamela Mckinney  located at their personal residence listed.    Subjective:    Patient ID: Pamela Mckinney, female    DOB: 06-05-1946,    Past Medical History:  Diagnosis Date  . Allergy   . Diverticulosis   . GERD (gastroesophageal reflux disease)   . History of hiatal hernia   . Hyperlipidemia   . Hypertension   . Hypothyroidism   . Osteoarthritis   . Post-operative nausea and vomiting   . SVD (spontaneous vaginal delivery)    x 2  . Tubular adenoma of colon    Past Surgical History:  Procedure Laterality Date  . ABLATION SAPHENOUS VEIN W/ RFA    . APPENDECTOMY    . CERVICAL FUSION    . CERVICAL LAMINECTOMY    . CHOLECYSTECTOMY    . COLONOSCOPY  10/2018   polyps  . JOINT REPLACEMENT Bilateral 2019  . LUMBAR FUSION  03/2019  . THYROID SURGERY    . TONSILLECTOMY    . TOTAL KNEE ARTHROPLASTY Right 08/2016  . UPPER GI ENDOSCOPY     Barrett's - no longer an issue per patient 04/13/19  . WISDOM TOOTH EXTRACTION     Social History   Socioeconomic History  . Marital status: Married    Spouse name: Not on file  . Number of children: 2  . Years of education: Not on file  . Highest education level: Not on file  Occupational History  . Occupation: caregiver  Tobacco Use  . Smoking status: Never  Smoker  . Smokeless tobacco: Never Used  Vaping Use  . Vaping Use: Never used  Substance and Sexual Activity  . Alcohol use: Yes    Comment: occ wine  . Drug use: No  . Sexual activity: Not on file  Other Topics Concern  . Not on file  Social History Narrative  . Not on file   Social Determinants of Health   Financial Resource Strain:   . Difficulty of Paying Living Expenses: Not on file  Food Insecurity:   . Worried About Charity fundraiser in the Last Year: Not on file  . Ran Out of Food in the Last Year: Not on file  Transportation Needs:   . Lack of Transportation (Medical): Not on file  . Lack of Transportation (Non-Medical): Not on file  Physical Activity:   . Days of Exercise per Week: Not on file  . Minutes of Exercise per Session: Not on file  Stress:   . Feeling of Stress : Not on file  Social Connections:   . Frequency of Communication with Friends and Family: Not on file  . Frequency of Social Gatherings with Friends and Family: Not on file  . Attends Religious Services: Not on file  . Active Member of Clubs or Organizations: Not on file  . Attends Club or  Organization Meetings: Not on file  . Marital Status: Not on file   Outpatient Encounter Medications as of 06/12/2020  Medication Sig  . amLODipine (NORVASC) 5 MG tablet Take 5 mg by mouth daily.  Marland Kitchen atenolol (TENORMIN) 25 MG tablet Take 25 mg by mouth daily.  . cholestyramine (QUESTRAN) 4 g packet Take 1 packet (4 g total) by mouth daily. Take 2 hours apart from all other medications  . dicyclomine (BENTYL) 10 MG capsule Take 1 capsule (10 mg total) by mouth in the morning and at bedtime.  Kendall Flack 575 MG/5ML SYRP Take by mouth daily.  Marland Kitchen levothyroxine (SYNTHROID) 125 MCG tablet TAKE 1 TABLET (125 MCG TOTAL) BY MOUTH DAILY BEFORE BREAKFAST.  Marland Kitchen lisinopril (PRINIVIL,ZESTRIL) 40 MG tablet Take 40 mg by mouth daily.  Marland Kitchen loratadine (CLARITIN) 10 MG tablet Take 10 mg by mouth daily.  . pantoprazole (PROTONIX)  40 MG tablet Take 40 mg by mouth daily.  . sertraline (ZOLOFT) 50 MG tablet Take 50 mg by mouth daily.  . simvastatin (ZOCOR) 20 MG tablet Take 20 mg by mouth at bedtime.   . hyoscyamine (LEVSIN SL) 0.125 MG SL tablet One tablet early AM and repeat in 6 hours (Patient not taking: Reported on 06/12/2020)  . Multiple Vitamins-Iron (ONE-DAILY MULTI-VITAMIN/IRON PO) Take by mouth daily. (Patient not taking: Reported on 06/12/2020)   No facility-administered encounter medications on file as of 06/12/2020.   ALLERGIES: Allergies  Allergen Reactions  . Codeine Nausea And Vomiting, Rash and Other (See Comments)    Any pain medication   VACCINATION STATUS:  There is no immunization history on file for this patient.  Thyroid Problem Presents for follow-up visit. Symptoms include heat intolerance. Patient reports no anxiety, cold intolerance, constipation, diarrhea, dry skin, fatigue, palpitations, tremors, weight gain or weight loss. (Has had nonstop hot flashes for years now.  These have not improved since she was initiated on thyroid hormone.) The symptoms have been stable.   Pamela Mckinney is a 74 year old female with medical history as above.  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    She reports history of left  partial thyroidectomy at approximate age of 19 years due to a "cold nodule." She has taken thyroid hormone replacement at various doses over the years currently at 125 mcg. She is compliant with taking her medication properly.approximately 5 pounds, intentionally.     Review of Systems  Constitutional: Negative for fatigue, weight gain and weight loss.  Cardiovascular: Negative for palpitations.  Gastrointestinal: Negative for constipation and diarrhea.  Endocrine: Positive for heat intolerance. Negative for cold intolerance.  Neurological: Negative for tremors.  Psychiatric/Behavioral: The patient is not nervous/anxious.     Objective:    Ht 5\' 10"  (1.778  m)   Wt 220 lb (99.8 kg) Comment: Last weight  LMP  (LMP Unknown)   BMI 31.57 kg/m   Wt Readings from Last 3 Encounters:  06/12/20 220 lb (99.8 kg)  01/11/20 219 lb (99.3 kg)  08/28/19 218 lb (98.9 kg)    BP Readings from Last 3 Encounters:  01/11/20 120/80  08/28/19 (!) 144/86  04/18/19 119/63    Physical Exam  Physical Exam- Telehealth- significantly limited due to nature of visit  Constitutional: Body mass index is 31.57 kg/m. , not in acute distress, normal state of mind Respiratory: Adequate breathing efforts  Labs from 09/02/2015 showed free T4 1.26, TSH elevated at 3.82 Thyroid ultrasound showed surgically absent left lobe, 2.2 x 0.7 x 1 cm right  lobe with no nodularity, 3.9 mm isthmus.  Recent Results (from the past 2160 hour(s))  TSH     Status: None   Collection Time: 06/02/20 12:00 AM  Result Value Ref Range   TSH 2.20 0.41 - 5.90    Comment: FREE T4- 1.24     Assessment & Plan:   1. Postsurgical hypothyroidism 2.  Multinodular goiter-resolved status post thyroidectomy  I have reviewed her available records. She has established, longstanding postsurgical hypothyroidism at approximate age of 24 years after left hemithyroidectomy for a cold nodule.   Her previsit labs suggest adequate hormone replacement.  Therefore, she is advised to continue her current dose of Levothyroxine 125 mcg po daily before breakfast.  She will be discussing her continued hot flashes with her PCP next month.  Will recheck thyroid profile prior to next visit in 6 months.   - The correct intake of thyroid hormone (Levothyroxine, Synthroid), is on empty stomach first thing in the morning, with water, separated by at least 30 minutes from breakfast and other medications,  and separated by more than 4 hours from calcium, iron, multivitamins, acid reflux medications (PPIs).  - This medication is a life-long medication and will be needed to correct thyroid hormone imbalances for the rest of  your life.  The dose may change from time to time, based on thyroid blood work.  - It is extremely important to be consistent taking this medication, near the same time each morning.  Her recent surveillance thyroid ultrasound from 08/2017 is unremarkable for any further nodular lesions. See above. No intervention or additional surveillance is required at this time.  I advised patient to maintain close follow up with her PCP for primary care needs.   I spent 20 minutes dedicated to the care of this patient on the date of this encounter to include pre-visit review of records, face-to-face time with the patient, and post visit ordering of  testing. Pamela Mckinney  participated in the discussions, expressed understanding, and voiced agreement with the above plans.  All questions were answered to her satisfaction. she is encouraged to contact clinic should she have any questions or concerns prior to her return visit.  Follow up plan: Return in about 6 months (around 12/13/2020) for Thyroid follow up, Previsit labs, Virtual visit ok.  Rayetta Pigg, FNP-BC [facility] Phone: [phone] Fax: [phone]  06/12/2020, 9:56 AM

## 2020-12-08 LAB — TSH: TSH: 2.67 (ref 0.41–5.90)

## 2020-12-15 ENCOUNTER — Other Ambulatory Visit: Payer: Self-pay

## 2020-12-15 ENCOUNTER — Encounter: Payer: Self-pay | Admitting: Nurse Practitioner

## 2020-12-15 ENCOUNTER — Ambulatory Visit (INDEPENDENT_AMBULATORY_CARE_PROVIDER_SITE_OTHER): Payer: Medicare Other | Admitting: Nurse Practitioner

## 2020-12-15 VITALS — BP 158/95 | HR 61 | Ht 70.0 in | Wt 222.8 lb

## 2020-12-15 DIAGNOSIS — E890 Postprocedural hypothyroidism: Secondary | ICD-10-CM

## 2020-12-15 NOTE — Patient Instructions (Signed)

## 2020-12-15 NOTE — Progress Notes (Signed)
12/15/2020     Endocrinology Follow Up Visit    Subjective:    Patient ID: Pamela Mckinney, female    DOB: 1946/09/29,    Past Medical History:  Diagnosis Date  . Allergy   . Diverticulosis   . GERD (gastroesophageal reflux disease)   . History of hiatal hernia   . Hyperlipidemia   . Hypertension   . Hypothyroidism   . Osteoarthritis   . Post-operative nausea and vomiting   . SVD (spontaneous vaginal delivery)    x 2  . Tubular adenoma of colon    Past Surgical History:  Procedure Laterality Date  . ABLATION SAPHENOUS VEIN W/ RFA    . APPENDECTOMY    . CERVICAL FUSION    . CERVICAL LAMINECTOMY    . CHOLECYSTECTOMY    . COLONOSCOPY  10/2018   polyps  . JOINT REPLACEMENT Bilateral 2019  . LUMBAR FUSION  03/2019  . THYROID SURGERY    . TONSILLECTOMY    . TOTAL KNEE ARTHROPLASTY Right 08/2016  . UPPER GI ENDOSCOPY     Barrett's - no longer an issue per patient 04/13/19  . WISDOM TOOTH EXTRACTION     Social History   Socioeconomic History  . Marital status: Married    Spouse name: Not on file  . Number of children: 2  . Years of education: Not on file  . Highest education level: Not on file  Occupational History  . Occupation: caregiver  Tobacco Use  . Smoking status: Never Smoker  . Smokeless tobacco: Never Used  Vaping Use  . Vaping Use: Never used  Substance and Sexual Activity  . Alcohol use: Yes    Comment: occ wine  . Drug use: No  . Sexual activity: Not on file  Other Topics Concern  . Not on file  Social History Narrative  . Not on file   Social Determinants of Health   Financial Resource Strain: Not on file  Food Insecurity: Not on file  Transportation Needs: Not on file  Physical Activity: Not on file  Stress: Not on file  Social Connections: Not on file   Outpatient Encounter Medications as of 12/15/2020  Medication Sig  . atenolol (TENORMIN) 25 MG tablet Take 25 mg by mouth daily.  Kendall Flack 575 MG/5ML SYRP Take by mouth daily.   Marland Kitchen levothyroxine (SYNTHROID) 125 MCG tablet TAKE 1 TABLET (125 MCG TOTAL) BY MOUTH DAILY BEFORE BREAKFAST.  Marland Kitchen lisinopril (PRINIVIL,ZESTRIL) 40 MG tablet Take 40 mg by mouth daily.  Marland Kitchen loratadine (CLARITIN) 10 MG tablet Take 10 mg by mouth daily.  . Multiple Vitamins-Iron (ONE-DAILY MULTI-VITAMIN/IRON PO) Take by mouth daily.  . pantoprazole (PROTONIX) 40 MG tablet Take 40 mg by mouth daily.  . sertraline (ZOLOFT) 50 MG tablet Take 50 mg by mouth daily.  . simvastatin (ZOCOR) 20 MG tablet Take 20 mg by mouth at bedtime.   Marland Kitchen amLODipine (NORVASC) 5 MG tablet Take 5 mg by mouth daily. (Patient not taking: Reported on 12/15/2020)  . [DISCONTINUED] cholestyramine (QUESTRAN) 4 g packet Take 1 packet (4 g total) by mouth daily. Take 2 hours apart from all other medications (Patient not taking: Reported on 12/15/2020)  . [DISCONTINUED] dicyclomine (BENTYL) 10 MG capsule Take 1 capsule (10 mg total) by mouth in the morning and at bedtime.  . [DISCONTINUED] hyoscyamine (LEVSIN SL) 0.125 MG SL tablet One tablet early AM and repeat in 6 hours (Patient not taking: Reported on 06/12/2020)   No facility-administered encounter medications  on file as of 12/15/2020.   ALLERGIES: Allergies  Allergen Reactions  . Codeine Nausea And Vomiting, Rash and Other (See Comments)    Any pain medication   VACCINATION STATUS:  There is no immunization history on file for this patient.  Thyroid Problem Presents for follow-up visit. Patient reports no anxiety, cold intolerance, constipation, diarrhea, dry skin, fatigue, heat intolerance, palpitations, tremors, weight gain or weight loss. (Has had nonstop hot flashes for years now.  These have not improved since she was initiated on thyroid hormone.) The symptoms have been stable.   Pamela Mckinney is a 75 year old female with medical history as above.  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    She reports history of left  partial thyroidectomy at  approximate age of 80 years due to a "cold nodule." She has taken thyroid hormone replacement at various doses over the years currently at 125 mcg. She is compliant with taking her medication properly.approximately 5 pounds, intentionally.     Review of systems  Constitutional: + Minimally fluctuating body weight,  current Body mass index is 31.97 kg/m. , no fatigue, no subjective hyperthermia, no subjective hypothermia Eyes: no blurry vision, no xerophthalmia ENT: no sore throat, no nodules palpated in throat, no dysphagia/odynophagia, no hoarseness Cardiovascular: no chest pain, no shortness of breath, no palpitations, no leg swelling Respiratory: no cough, no shortness of breath Gastrointestinal: no nausea/vomiting/diarrhea Musculoskeletal: no muscle/joint aches Skin: no rashes, no hyperemia Neurological: no tremors, no numbness, no tingling, no dizziness Psychiatric: no depression, no anxiety, reports increased stress while caring for her husband with multiple medical issues.  Objective:    BP (!) 158/95 (BP Location: Left Arm, Patient Position: Sitting)   Pulse 61   Ht 5\' 10"  (1.778 m)   Wt 222 lb 12.8 oz (101.1 kg)   LMP  (LMP Unknown)   BMI 31.97 kg/m   Wt Readings from Last 3 Encounters:  12/15/20 222 lb 12.8 oz (101.1 kg)  06/12/20 220 lb (99.8 kg)  01/11/20 219 lb (99.3 kg)    BP Readings from Last 3 Encounters:  12/15/20 (!) 158/95  01/11/20 120/80  08/28/19 (!) 144/86     Physical Exam- Limited  Constitutional:  Body mass index is 31.97 kg/m. , not in acute distress, normal state of mind Eyes:  EOMI, no exophthalmos Neck: Supple Cardiovascular: RRR, no murmers, rubs, or gallops, no edema Respiratory: Adequate breathing efforts, no crackles, rales, rhonchi, or wheezing Musculoskeletal: no gross deformities, strength intact in all four extremities, no gross restriction of joint movements Skin:  no rashes, no hyperemia Neurological: no tremor with outstretched  hands    Recent Results (from the past 2160 hour(s))  TSH     Status: None   Collection Time: 12/08/20 12:00 AM  Result Value Ref Range   TSH 2.67 0.41 - 5.90    Comment: FREE T4- 1.27   Labs from 09/02/2015 showed free T4 1.26, TSH elevated at 3.82 Thyroid ultrasound showed surgically absent left lobe, 2.2 x 0.7 x 1 cm right lobe with no nodularity, 3.9 mm isthmus.  Assessment & Plan:   1. Postsurgical hypothyroidism I have reviewed her available records. She has established, longstanding postsurgical hypothyroidism at approximate age of 81 years after left hemithyroidectomy for a cold nodule. Her most recent surveillance thyroid ultrasound from 08/2017 is unremarkable for any further nodular lesions. See above. No intervention or additional surveillance is required at this time.   -Her previsit thyroid function tests are consistent with  appropriate hormone replacement.  She is advised to continue her current dose of Levothyroxine at 125 mcg po daily before breakfast.   - The correct intake of thyroid hormone (Levothyroxine, Synthroid), is on empty stomach first thing in the morning, with water, separated by at least 30 minutes from breakfast and other medications,  and separated by more than 4 hours from calcium, iron, multivitamins, acid reflux medications (PPIs).  - This medication is a life-long medication and will be needed to correct thyroid hormone imbalances for the rest of your life.  The dose may change from time to time, based on thyroid blood work.  - It is extremely important to be consistent taking this medication, near the same time each morning.    I advised patient to maintain close follow up with her PCP for primary care needs.      - Time spent on this patient care encounter:  30 minutes of which 50% was spent in  counseling and the rest reviewing  her current and  previous labs / studies and medications  doses and developing a plan for long term care, and  documenting this care. Ivar Drape  participated in the discussions, expressed understanding, and voiced agreement with the above plans.  All questions were answered to her satisfaction. she is encouraged to contact clinic should she have any questions or concerns prior to her return visit.  Follow up plan: Return in about 1 year (around 12/15/2021) for Thyroid follow up, Previsit labs.  Rayetta Pigg, [facility] [address] [place], [place] [address] Phone: [phone] Fax: [phone]  12/15/2020, 10:13 AM

## 2021-02-05 ENCOUNTER — Other Ambulatory Visit: Payer: Self-pay | Admitting: "Endocrinology

## 2021-02-11 ENCOUNTER — Encounter: Payer: Self-pay | Admitting: Gastroenterology

## 2021-03-25 ENCOUNTER — Telehealth: Payer: Self-pay | Admitting: *Deleted

## 2021-03-25 NOTE — Telephone Encounter (Signed)
John,  Can you please review this airway note and advise on if pt needs [facility] Case-  thanks  Joanne Chars, CRNA  Certified Registered Nurse Anesthetist  Specialty:  Anesthesiology  Anesthesia Procedure Notes  Signed  Date of Service:  04/17/2019 11:31 AM         Procedure Orders  Anesthesia Airway [[id number]] ordered by Eligha Bridegroom, CRNA      Signed           Show:Clear all [x] Manual[x] Template[] Copied  Added by: [x] Eligha Bridegroom, CRNA   [] Hover for details  Procedure Name: Intubation Date/Time: 04/17/2019 10:45 AM Performed by: Eligha Bridegroom, CRNA Pre-anesthesia Checklist: Patient identified, Emergency Drugs available, Suction available, Patient being monitored and Timeout performed Patient Re-evaluated:Patient Re-evaluated prior to induction Oxygen Delivery Method: Circle system utilized Preoxygenation: Pre-oxygenation with 100% oxygen Induction Type: IV induction Ventilation: Mask ventilation without difficulty and Oral airway inserted - appropriate to patient size Laryngoscope Size: Glidescope Grade View: Grade III Tube size: 7.0 mm Number of attempts: 2 Airway Equipment and Method: Stylet and Video-laryngoscopy Placement Confirmation: ETT inserted through vocal cords under direct vision,  positive ETCO2 and breath sounds checked- equal and bilateral Secured at: 21 cm Tube secured with: Tape Dental Injury: Teeth and Oropharynx as per pre-operative assessment

## 2021-03-25 NOTE — Telephone Encounter (Signed)
Dr Havery Moros,  This pt had a difficult intubation in 2020 and per below John states needs hospital case -  Do you want her to have an OV or direct at [facility]?- last colon with you 2019- 3 polyps-   Thanks  Lelan Pons PV

## 2021-03-25 NOTE — Telephone Encounter (Signed)
[place],  Can you please schedule Mrs Gurevich at [facility] for her Colon per Dr Havery Moros- Thanks   Lelan Pons PV

## 2021-03-25 NOTE — Telephone Encounter (Signed)
She can be directly booked at the hospital for colonoscopy due to difficult intubation.  Thanks

## 2021-03-26 ENCOUNTER — Encounter: Payer: Self-pay | Admitting: *Deleted

## 2021-03-26 ENCOUNTER — Other Ambulatory Visit: Payer: Self-pay

## 2021-03-26 DIAGNOSIS — T884XXA Failed or difficult intubation, initial encounter: Secondary | ICD-10-CM | POA: Insufficient documentation

## 2021-03-26 DIAGNOSIS — Z86010 Personal history of colonic polyps: Secondary | ICD-10-CM

## 2021-03-26 NOTE — Telephone Encounter (Signed)
Spoke with patient to schedule her colonoscopy at [facility]. Patient has been scheduled on Monday, 06/01/21 at 9:30 am. Patient is aware that she will need to arrive at [facility] at 8 am with a care partner. Advised patient that she will keep her pre-visit appt as scheduled. Patient verbalized understanding and had no concerns at the end of the call.   CASE ID: [id number]

## 2021-04-14 ENCOUNTER — Ambulatory Visit (AMBULATORY_SURGERY_CENTER): Payer: Medicare Other | Admitting: *Deleted

## 2021-04-14 ENCOUNTER — Other Ambulatory Visit: Payer: Self-pay

## 2021-04-14 VITALS — Ht 70.0 in | Wt 220.0 lb

## 2021-04-14 DIAGNOSIS — Z86010 Personal history of colonic polyps: Secondary | ICD-10-CM

## 2021-04-14 NOTE — Progress Notes (Signed)
Patient's pre-visit was done today over the phone with the patient due to COVID-19 pandemic. Name,DOB and address verified. Insurance verified. Patient denies any allergies to Eggs and Soy. Patient denies any problems with anesthesia/sedation. Patient denies taking diet pills or blood thinners. Packet of Prep instructions mailed to patient -pt is aware. Patient understands to call us back with any questions or concerns. Patient is aware of our care-partner policy and AUEBV-13 safety protocol.   The patient is COVID-19 vaccinated x4, per patient. Patient denies constipation.

## 2021-04-28 ENCOUNTER — Encounter: Payer: Medicare Other | Admitting: Gastroenterology

## 2021-05-11 ENCOUNTER — Encounter: Payer: Self-pay | Admitting: Gastroenterology

## 2021-05-21 ENCOUNTER — Encounter (HOSPITAL_COMMUNITY): Payer: Self-pay | Admitting: Gastroenterology

## 2021-05-21 ENCOUNTER — Other Ambulatory Visit: Payer: Self-pay

## 2021-05-26 ENCOUNTER — Telehealth: Payer: Self-pay | Admitting: Gastroenterology

## 2021-05-26 NOTE — Telephone Encounter (Signed)
Spoke with patient to reschedule her colonoscopy at [facility]. Expressed to patient that I was sorry for her loss. Patient's recall colonoscopy has been rescheduled to Monday, 07/06/21 at 10:30 am, arriving at 9 am with a care partner. Patient states that she has her prep and instructions. Will mail pt updated instructions. Patient verbalized understanding of all information and she had no concerns at the end of the call.  CASE ID: [id number]

## 2021-05-26 NOTE — Telephone Encounter (Signed)
Patients friend called requesting to reschedule the procedure scheduled at the hospital due tp the patients spouse passing away.

## 2021-05-26 NOTE — Telephone Encounter (Signed)
Lm on vm for patient to return call.  Colonoscopy at [facility] on 06/01/21 has been cancelled.

## 2021-06-01 ENCOUNTER — Ambulatory Visit (HOSPITAL_COMMUNITY): Admission: RE | Admit: 2021-06-01 | Payer: Medicare Other | Source: Home / Self Care | Admitting: Gastroenterology

## 2021-06-01 SURGERY — COLONOSCOPY WITH PROPOFOL
Anesthesia: Monitor Anesthesia Care

## 2021-06-29 ENCOUNTER — Encounter (HOSPITAL_COMMUNITY): Payer: Self-pay | Admitting: Gastroenterology

## 2021-06-29 ENCOUNTER — Other Ambulatory Visit: Payer: Self-pay

## 2021-07-06 ENCOUNTER — Other Ambulatory Visit: Payer: Self-pay

## 2021-07-06 ENCOUNTER — Ambulatory Visit (HOSPITAL_COMMUNITY): Payer: Medicare Other | Admitting: Anesthesiology

## 2021-07-06 ENCOUNTER — Encounter (HOSPITAL_COMMUNITY): Payer: Self-pay | Admitting: Gastroenterology

## 2021-07-06 ENCOUNTER — Encounter (HOSPITAL_COMMUNITY)
Admission: RE | Disposition: A | Discharge status: Home / Self Care | Payer: Self-pay | Source: Home / Self Care | Attending: Gastroenterology

## 2021-07-06 ENCOUNTER — Ambulatory Visit (HOSPITAL_COMMUNITY)
Admission: RE | Admit: 2021-07-06 | Discharge: 2021-07-06 | Disposition: A | Discharge status: Home / Self Care | Payer: Medicare Other | Attending: Gastroenterology | Admitting: Gastroenterology

## 2021-07-06 DIAGNOSIS — Z885 Allergy status to narcotic agent status: Secondary | ICD-10-CM | POA: Insufficient documentation

## 2021-07-06 DIAGNOSIS — Z96651 Presence of right artificial knee joint: Secondary | ICD-10-CM | POA: Insufficient documentation

## 2021-07-06 DIAGNOSIS — Z9049 Acquired absence of other specified parts of digestive tract: Secondary | ICD-10-CM | POA: Diagnosis not present

## 2021-07-06 DIAGNOSIS — K5730 Diverticulosis of large intestine without perforation or abscess without bleeding: Secondary | ICD-10-CM | POA: Insufficient documentation

## 2021-07-06 DIAGNOSIS — Z79890 Hormone replacement therapy (postmenopausal): Secondary | ICD-10-CM | POA: Insufficient documentation

## 2021-07-06 DIAGNOSIS — D122 Benign neoplasm of ascending colon: Secondary | ICD-10-CM | POA: Diagnosis not present

## 2021-07-06 DIAGNOSIS — Z8371 Family history of colonic polyps: Secondary | ICD-10-CM | POA: Diagnosis not present

## 2021-07-06 DIAGNOSIS — Z7951 Long term (current) use of inhaled steroids: Secondary | ICD-10-CM | POA: Diagnosis not present

## 2021-07-06 DIAGNOSIS — D126 Benign neoplasm of colon, unspecified: Secondary | ICD-10-CM

## 2021-07-06 DIAGNOSIS — D123 Benign neoplasm of transverse colon: Secondary | ICD-10-CM | POA: Insufficient documentation

## 2021-07-06 DIAGNOSIS — Z860100 Personal history of colon polyps, unspecified: Secondary | ICD-10-CM

## 2021-07-06 DIAGNOSIS — Z1211 Encounter for screening for malignant neoplasm of colon: Secondary | ICD-10-CM | POA: Insufficient documentation

## 2021-07-06 DIAGNOSIS — Z79899 Other long term (current) drug therapy: Secondary | ICD-10-CM | POA: Diagnosis not present

## 2021-07-06 DIAGNOSIS — K529 Noninfective gastroenteritis and colitis, unspecified: Secondary | ICD-10-CM

## 2021-07-06 DIAGNOSIS — K648 Other hemorrhoids: Secondary | ICD-10-CM | POA: Insufficient documentation

## 2021-07-06 DIAGNOSIS — D120 Benign neoplasm of cecum: Secondary | ICD-10-CM | POA: Diagnosis not present

## 2021-07-06 HISTORY — PX: POLYPECTOMY: SHX5525

## 2021-07-06 HISTORY — PX: BIOPSY: SHX5522

## 2021-07-06 HISTORY — PX: COLONOSCOPY WITH PROPOFOL: SHX5780

## 2021-07-06 SURGERY — COLONOSCOPY WITH PROPOFOL
Anesthesia: Monitor Anesthesia Care

## 2021-07-06 MED ORDER — PROPOFOL 500 MG/50ML IV EMUL
INTRAVENOUS | Status: DC | PRN
  Administered 2021-07-06: 125 ug/kg/min via INTRAVENOUS

## 2021-07-06 MED ORDER — PROPOFOL 10 MG/ML IV BOLUS
INTRAVENOUS | Status: DC | PRN
  Administered 2021-07-06 (×2): 50 mg via INTRAVENOUS

## 2021-07-06 MED ORDER — LACTATED RINGERS IV SOLN: INTRAVENOUS | Status: DC | PRN

## 2021-07-06 MED ORDER — PROPOFOL 1000 MG/100ML IV EMUL
INTRAVENOUS | Status: AC
  Filled 2021-07-06: qty 100

## 2021-07-06 MED ORDER — PROPOFOL 10 MG/ML IV BOLUS
INTRAVENOUS | Status: AC
  Filled 2021-07-06: qty 20

## 2021-07-06 MED ORDER — PROPOFOL 500 MG/50ML IV EMUL
INTRAVENOUS | Status: AC
  Filled 2021-07-06: qty 50

## 2021-07-06 MED ORDER — LACTATED RINGERS IV SOLN
INTRAVENOUS | Status: DC
  Administered 2021-07-06: 1000 mL via INTRAVENOUS

## 2021-07-06 MED ORDER — ONDANSETRON HCL 4 MG/2ML IJ SOLN
INTRAMUSCULAR | Status: DC | PRN
  Administered 2021-07-06: 4 mg via INTRAVENOUS

## 2021-07-06 MED ORDER — LIDOCAINE 2% (20 MG/ML) 5 ML SYRINGE
INTRAMUSCULAR | Status: DC | PRN
  Administered 2021-07-06: 40 mg via INTRAVENOUS

## 2021-07-06 MED ORDER — SODIUM CHLORIDE 0.9 % IV SOLN: INTRAVENOUS | Status: DC

## 2021-07-06 SURGICAL SUPPLY — 21 items

## 2021-07-06 NOTE — Anesthesia Procedure Notes (Signed)
Procedure Name: MAC Date/Time: 07/06/2021 10:46 AM Performed by: Cynda Familia, CRNA Pre-anesthesia Checklist: Emergency Drugs available, Suction available, Patient identified, Patient being monitored and Timeout performed Patient Re-evaluated:Patient Re-evaluated prior to induction Oxygen Delivery Method: Simple face mask Placement Confirmation: positive ETCO2 and breath sounds checked- equal and bilateral Dental Injury: Teeth and Oropharynx as per pre-operative assessment

## 2021-07-06 NOTE — Anesthesia Preprocedure Evaluation (Signed)
Anesthesia Evaluation  Patient identified by MRN, date of birth, ID band Patient awake    Reviewed: Allergy & Precautions, NPO status , Patient's Chart, lab work & pertinent test results, reviewed documented beta blocker date and time   History of Anesthesia Complications (+) PONV, DIFFICULT AIRWAY and history of anesthetic complications  Airway Mallampati: II  TM Distance: >3 FB Neck ROM: Full    Dental  (+) Teeth Intact, Dental Advisory Given   Pulmonary neg pulmonary ROS,    Pulmonary exam normal breath sounds clear to auscultation       Cardiovascular hypertension, Pt. on home beta blockers and Pt. on medications Normal cardiovascular exam Rhythm:Regular Rate:Normal     Neuro/Psych negative neurological ROS  negative psych ROS   GI/Hepatic Neg liver ROS, hiatal hernia, GERD  ,personal hx of colon polyps   Endo/Other  Hypothyroidism Obesity   Renal/GU negative Renal ROS     Musculoskeletal  (+) Arthritis ,   Abdominal   Peds  Hematology negative hematology ROS (+)   Anesthesia Other Findings Day of surgery medications reviewed with the patient.  Reproductive/Obstetrics                             Anesthesia Physical Anesthesia Plan  ASA: 3  Anesthesia Plan: MAC   Post-op Pain Management:    Induction: Intravenous  PONV Risk Score and Plan: 3 and Propofol infusion and Treatment may vary due to age or medical condition  Airway Management Planned: Nasal Cannula and Natural Airway  Additional Equipment:   Intra-op Plan:   Post-operative Plan:   Informed Consent: I have reviewed the patients History and Physical, chart, labs and discussed the procedure including the risks, benefits and alternatives for the proposed anesthesia with the patient or authorized representative who has indicated his/her understanding and acceptance.     Dental advisory given  Plan Discussed  with: CRNA and Anesthesiologist  Anesthesia Plan Comments:         Anesthesia Quick Evaluation

## 2021-07-06 NOTE — Op Note (Signed)
[facility] Patient Name: Pamela Mckinney Procedure Date: 07/06/2021 MRN: [id number] Attending MD: Carlota Raspberry. Havery Moros , MD Date of Birth: [birth year] CSN: [id number] Age: 75 Admit Type: Outpatient Procedure:                Colonoscopy Indications:              High risk colon cancer surveillance: Personal                            history of colonic polyps - 10/2017 - also                            incidentally with loose stools which have persisted                            over time. Providers:                Carlota Raspberry. Havery Moros, MD, Dulcy Fanny, Tyrone Apple, Technician, Luan Moore, Technician,                            Virgia Land, CRNA Referring MD:              Medicines:                Monitored Anesthesia Care Complications:            No immediate complications. Estimated blood loss:                            Minimal. Estimated Blood Loss:     Estimated blood loss was minimal. Procedure:                Pre-Anesthesia Assessment:                           - Prior to the procedure, a History and Physical                            was performed, and patient medications and                            allergies were reviewed. The patient's tolerance of                            previous anesthesia was also reviewed. The risks                            and benefits of the procedure and the sedation                            options and risks were discussed with the patient.                            All questions were answered, and informed  consent                            was obtained. Prior Anticoagulants: The patient has                            taken no previous anticoagulant or antiplatelet                            agents. ASA Grade Assessment: III - A patient with                            severe systemic disease. After reviewing the risks                            and benefits, the patient was deemed  in                            satisfactory condition to undergo the procedure.                           After obtaining informed consent, the colonoscope                            was passed under direct vision. Throughout the                            procedure, the patient's blood pressure, pulse, and                            oxygen saturations were monitored continuously. The                            PCF-HQ190L ([id number] Olympus colonoscope was                            introduced through the anus and advanced to the the                            terminal ileum, with identification of the                            appendiceal orifice and IC valve. The colonoscopy                            was performed without difficulty. The patient                            tolerated the procedure well. The quality of the                            bowel preparation was good. The terminal ileum,  ileocecal valve, appendiceal orifice, and rectum                            were photographed. Scope In: 10:53:00 AM Scope Out: 11:19:26 AM Scope Withdrawal Time: 0 hours 23 minutes 33 seconds  Total Procedure Duration: 0 hours 26 minutes 26 seconds  Findings:      The perianal and digital rectal examinations were normal.      The terminal ileum appeared normal.      A diminutive polyp was found in the cecum. The polyp was sessile. The       polyp was removed with a cold biopsy forceps. Resection and retrieval       were complete.      Two sessile polyps were found in the ascending colon. The polyps were       diminutive in size. These polyps were removed with a cold biopsy       forceps. Resection and retrieval were complete.      Four sessile polyps were found in the transverse colon. The polyps were       3 to 5 mm in size. These polyps were removed with a cold snare.       Resection and retrieval were complete.      A 3 mm polyp was found in the recto-sigmoid  colon. The polyp was       sessile. The polyp was removed with a cold snare. Resection and       retrieval were complete.      A few small-mouthed diverticula were found in the sigmoid colon.      Internal hemorrhoids were found during retroflexion. The hemorrhoids       were small.      The exam was otherwise without abnormality.      Biopsies for histology were taken with a cold forceps from the right       colon, left colon and transverse colon for evaluation of microscopic       colitis. Impression:               - The examined portion of the ileum was normal.                           - One diminutive polyp in the cecum, removed with a                            cold biopsy forceps. Resected and retrieved.                           - Two diminutive polyps in the ascending colon,                            removed with a cold biopsy forceps. Resected and                            retrieved.                           - Four 3 to 5 mm polyps in the transverse colon,  removed with a cold snare. Resected and retrieved.                           - One 3 mm polyp at the recto-sigmoid colon,                            removed with a cold snare. Resected and retrieved.                           - Diverticulosis in the sigmoid colon.                           - Internal hemorrhoids.                           - The examination was otherwise normal.                           - Biopsies were taken with a cold forceps from the                            right colon, left colon and transverse colon for                            evaluation of microscopic colitis. Moderate Sedation:      No moderate sedation, case performed with MAC Recommendation:           - Patient has a contact number available for                            emergencies. The signs and symptoms of potential                            delayed complications were discussed with the                             patient. Return to normal activities tomorrow.                            Written discharge instructions were provided to the                            patient.                           - Resume previous diet.                           - Continue present medications.                           - Await pathology results with further                            recommendations Procedure Code(s):        --- Professional ---  45385, Colonoscopy, flexible; with removal of                            tumor(s), polyp(s), or other lesion(s) by snare                            technique                           45380, 38, Colonoscopy, flexible; with biopsy,                            single or multiple Diagnosis Code(s):        --- Professional ---                           K63.5, Polyp of colon                           Z86.010, Personal history of colonic polyps                           K64.8, Other hemorrhoids                           K57.30, Diverticulosis of large intestine without                            perforation or abscess without bleeding CPT copyright 2019 [employer]. All rights reserved. The codes documented in this report are preliminary and upon coder review may  be revised to meet current compliance requirements. Remo Lipps P. Mayley Lish, MD 07/06/2021 11:27:47 AM This report has been signed electronically. Number of Addenda: 0

## 2021-07-06 NOTE — Discharge Instructions (Signed)
YOU HAD AN ENDOSCOPIC PROCEDURE TODAY: Refer to the procedure report and other information in the discharge instructions given to you for any specific questions about what was found during the examination. If this information does not answer your questions, please call [facility] office at [phone] to clarify.  ° °YOU SHOULD EXPECT: Some feelings of bloating in the abdomen. Passage of more gas than usual. Walking can help get rid of the air that was put into your GI tract during the procedure and reduce the bloating. If you had a lower endoscopy (such as a colonoscopy or flexible sigmoidoscopy) you may notice spotting of blood in your stool or on the toilet paper. Some abdominal soreness may be present for a day or two, also. ° °DIET: Your first meal following the procedure should be a light meal and then it is ok to progress to your normal diet. A half-sandwich or bowl of soup is an example of a good first meal. Heavy or fried foods are harder to digest and may make you feel nauseous or bloated. Drink plenty of fluids but you should avoid alcoholic beverages for 24 hours. If you had a esophageal dilation, please see attached instructions for diet.   ° °ACTIVITY: Your care partner should take you home directly after the procedure. You should plan to take it easy, moving slowly for the rest of the day. You can resume normal activity the day after the procedure however YOU SHOULD NOT DRIVE, use power tools, machinery or perform tasks that involve climbing or major physical exertion for 24 hours (because of the sedation medicines used during the test).  ° °SYMPTOMS TO REPORT IMMEDIATELY: °A gastroenterologist can be reached at any hour. Please call [phone]  for any of the following symptoms:  °Following lower endoscopy (colonoscopy, flexible sigmoidoscopy) °Excessive amounts of blood in the stool  °Significant tenderness, worsening of abdominal pains  °Swelling of the abdomen that is new, acute  °Fever of 100° or  higher  °Following upper endoscopy (EGD, EUS, ERCP, esophageal dilation) °Vomiting of blood or coffee ground material  °New, significant abdominal pain  °New, significant chest pain or pain under the shoulder blades  °Painful or persistently difficult swallowing  °New shortness of breath  °Black, tarry-looking or red, bloody stools ° °FOLLOW UP:  °If any biopsies were taken you will be contacted by phone or by letter within the next 1-3 weeks. Call [phone]  if you have not heard about the biopsies in 3 weeks.  °Please also call with any specific questions about appointments or follow up tests. ° °

## 2021-07-06 NOTE — Anesthesia Postprocedure Evaluation (Signed)
Anesthesia Post Note  Patient: Pamela Mckinney  Procedure(s) Performed: COLONOSCOPY WITH PROPOFOL POLYPECTOMY BIOPSY     Patient location during evaluation: Endoscopy Anesthesia Type: MAC Level of consciousness: awake and alert Pain management: pain level controlled Vital Signs Assessment: post-procedure vital signs reviewed and stable Respiratory status: spontaneous breathing, nonlabored ventilation, respiratory function stable and patient connected to nasal cannula oxygen Cardiovascular status: stable and blood pressure returned to baseline Postop Assessment: no apparent nausea or vomiting Anesthetic complications: no   No notable events documented.  Last Vitals:  Vitals:   07/06/21 1145 07/06/21 1150  BP: (!) 162/59   Pulse: (!) 57 (!) 59  Resp: 15 (!) 23  Temp:    SpO2: 94% 99%    Last Pain:  Vitals:   07/06/21 1150  TempSrc:   PainSc: 0-No pain                 Catalina Gravel

## 2021-07-06 NOTE — H&P (Signed)
HPI:   Pamela Mckinney is a 75 y.o. female here for surveillance colonoscopy. Last exam 10/2017 with 3 adenomas, she wishes to continue surveillance. Baseline loose stools that continue to bother her. Otherwise feels well at baseline without complaints. She is reported to have a difficult airway per anesthesia in the past, as such at the hospital today for anesthesia support for her case.  Past Medical History:  Diagnosis Date   Allergy    Diverticulosis    GERD (gastroesophageal reflux disease)    History of hiatal hernia    Hyperlipidemia    Hypertension    Hypothyroidism    Osteoarthritis    Post-operative nausea and vomiting    SVD (spontaneous vaginal delivery)    x 2   Tubular adenoma of colon     Past Surgical History:  Procedure Laterality Date   ABLATION SAPHENOUS VEIN W/ RFA     APPENDECTOMY     CERVICAL FUSION     CERVICAL LAMINECTOMY     CHOLECYSTECTOMY     COLONOSCOPY  10/2018   polyps   JOINT REPLACEMENT Bilateral 2019   LUMBAR FUSION  03/2019   THYROID SURGERY     TONSILLECTOMY     TOTAL KNEE ARTHROPLASTY Right 08/2016   UPPER GI ENDOSCOPY     Barrett's - no longer an issue per patient 04/13/19   WISDOM TOOTH EXTRACTION      Family History  Problem Relation Age of Onset   Colon polyps Mother    Allergies Mother    Clotting disorder Mother    Cancer Father        lung   Stroke Maternal Grandmother    Cancer Maternal Grandfather        colon   Colon cancer Maternal Grandfather        70's   Esophageal cancer Neg Hx    Stomach cancer Neg Hx    Rectal cancer Neg Hx      Social History   Tobacco Use   Smoking status: Never   Smokeless tobacco: Never  Vaping Use   Vaping Use: Never used  Substance Use Topics   Alcohol use: Yes    Comment: occ wine   Drug use: No    Prior to Admission medications   Medication Sig Start Date End Date Taking? Authorizing Provider  ADVAIR DISKUS 250-50 MCG/ACT AEPB Inhale 1 puff into the  lungs 2 (two) times daily. 06/12/21  Yes [provider]  atenolol (TENORMIN) 25 MG tablet Take 25 mg by mouth daily. 10/23/19  Yes [provider]  levothyroxine (SYNTHROID) 125 MCG tablet TAKE 1 TABLET (125 MCG TOTAL) BY MOUTH DAILY BEFORE BREAKFAST. 02/05/21  Yes Reardon, Loree Fee J, NP  lisinopril (ZESTRIL) 5 MG tablet Take 5 mg by mouth daily.   Yes [provider]  pantoprazole (PROTONIX) 40 MG tablet Take 40 mg by mouth daily.   Yes [provider]  sertraline (ZOLOFT) 50 MG tablet Take 50 mg by mouth daily. 10/23/19  Yes [provider]  simvastatin (ZOCOR) 20 MG tablet Take 20 mg by mouth at bedtime.    Yes [provider]    Current Facility-Administered Medications  Medication Dose Route Frequency Provider Last Rate Last Admin   lactated ringers infusion   Intravenous Continuous Verlaine Embry, Carlota Raspberry, MD 10 mL/hr at 07/06/21 1001 1,000 mL at 07/06/21 1001    Allergies as of 05/26/2021 - Review  Complete 05/21/2021  Allergen Reaction Noted   Codeine Nausea And Vomiting, Rash, and Other (See Comments) 10/31/2017     Review of Systems:    As per HPI, otherwise negative    Physical Exam:  Vital signs in last 24 hours: Temp:  [97.8 F (36.6 C)] 97.8 F (36.6 C) (09/19 0933) Pulse Rate:  [65] 65 (09/19 0933) Resp:  [18] 18 (09/19 0933) BP: (125)/(70) 125/70 (09/19 0933) SpO2:  [97 %] 97 % (09/19 0933) Weight:  [99.8 kg] 99.8 kg (09/19 0933)   General:   Pleasant female in NAD Lungs:  Respirations even and unlabored. Lungs clear to auscultation bilaterally.    Heart:  Regular rate and rhythm; no MRG Abdomen:  Soft, nondistended, nontender.  Neurologic:  Alert and  oriented x4;  grossly normal neurologically. Psych:  Alert and cooperative. Normal affect.  Lab Results  Component Value Date   CREATININE 1.17 (H) 04/18/2019   BUN 14 04/18/2019   NA 137 04/18/2019   K 4.8 04/18/2019   CL 101 04/18/2019   CO2 24 04/18/2019     Lab Results  Component Value Date   WBC 17.3 (H) 04/18/2019   HGB 12.3 04/18/2019   HCT 37.6 04/18/2019   MCV 88.3 04/18/2019   PLT 206 04/18/2019      Impression / Plan:   75 y/o female here for surveillance colonoscopy, history of colon polyps in 2019, wants to do one more surveillance exam prior to stopping. History of difficult airway and at the hospital for anesthesia support. Have discussed risks / benefits of colonoscopy and anesthesia, she wishes to proceed. Of note has loose stools and will plan on taking biopsies to rule out microscopic colitis.  Jolly Mango, MD [facility]

## 2021-07-06 NOTE — Transfer of Care (Signed)
Immediate Anesthesia Transfer of Care Note  Patient: Pamela Mckinney  Procedure(s) Performed: COLONOSCOPY WITH PROPOFOL POLYPECTOMY BIOPSY  Patient Location: PACU and Endoscopy Unit  Anesthesia Type:MAC  Level of Consciousness: awake, alert  and oriented  Airway & Oxygen Therapy: Patient Spontanous Breathing and Patient connected to face mask oxygen  Post-op Assessment: Report given to RN and Post -op Vital signs reviewed and stable  Post vital signs: Reviewed and stable  Last Vitals:  Vitals Value Taken Time  BP    Temp    Pulse 57 07/06/21 1126  Resp 16 07/06/21 1126  SpO2 97 % 07/06/21 1126  Vitals shown include unvalidated device data.  Last Pain:  Vitals:   07/06/21 0933  TempSrc: Oral  PainSc: 0-No pain         Complications: No notable events documented.

## 2021-07-07 ENCOUNTER — Encounter (HOSPITAL_COMMUNITY): Payer: Self-pay | Admitting: Gastroenterology

## 2021-07-07 LAB — SURGICAL PATHOLOGY

## 2021-07-28 ENCOUNTER — Ambulatory Visit (INDEPENDENT_AMBULATORY_CARE_PROVIDER_SITE_OTHER): Payer: Medicare Other | Admitting: Gastroenterology

## 2021-07-28 ENCOUNTER — Encounter: Payer: Self-pay | Admitting: Gastroenterology

## 2021-07-28 ENCOUNTER — Other Ambulatory Visit (HOSPITAL_COMMUNITY): Payer: Self-pay

## 2021-07-28 VITALS — BP 160/80 | HR 64 | Ht 70.0 in | Wt 242.0 lb

## 2021-07-28 DIAGNOSIS — Z79899 Other long term (current) drug therapy: Secondary | ICD-10-CM | POA: Diagnosis not present

## 2021-07-28 DIAGNOSIS — K580 Irritable bowel syndrome with diarrhea: Secondary | ICD-10-CM | POA: Diagnosis not present

## 2021-07-28 DIAGNOSIS — R1312 Dysphagia, oropharyngeal phase: Secondary | ICD-10-CM

## 2021-07-28 DIAGNOSIS — K219 Gastro-esophageal reflux disease without esophagitis: Secondary | ICD-10-CM

## 2021-07-28 DIAGNOSIS — R059 Cough, unspecified: Secondary | ICD-10-CM

## 2021-07-28 DIAGNOSIS — R1310 Dysphagia, unspecified: Secondary | ICD-10-CM

## 2021-07-28 NOTE — Progress Notes (Signed)
HPI :  75 year old female here for follow-up visit for chronic diarrhea, reflux, dysphagia.  This is my first time seeing her in the office, she has seen Amy Esterwood on a few occasions over the past few years.  She carries a diagnosis of chronic diarrhea which was thought to be potentially related to her postcholecystectomy status or IBS.  She also has a history of colon polyps.  Since her last visit with [place] she underwent a colonoscopy with me at the hospital due to difficult intubation status.  She had 8 small adenomas on this exam.  Otherwise no overt inflammatory changes.  Biopsies ruled out microscopic colitis.  She has been placed on Questran in the past which per report shows that it was helpful however she states today that she did not think it helped too much and she did not like taking it.  At baseline currently she is currently having 1-2 bowel movements per day which are soft.  She has been on Benefiber since the colonoscopy and states that has helped.  Previously she had loose stools.  No blood in her stool.  Her main concern is having loose stool when she eats out of her home and in restaurants.  Certain foods can definitely trigger her symptoms.  She has had negative celiac testing in the past.  She has had a cholecystectomy about 20 years ago.  She denies much of any abdominal pains at this time.  She has a history of GERD for which she takes Protonix 40 mg a day.  She states this works really well for her in general however if she misses a dose she will have quick recurrence of symptoms.  She is happy with the regimen.  She does endorse dysphagia to liquids over the past 6 months.  She states that "goes down the wrong pipe" and leads to coughing, she states she feels like she is aspirating liquids.  She denies any dysphagia to solids when she eats.  She states she had an EGD years ago by Dr. Docia Furl which she did not think showed any Barrett's esophagus, however states she also was  told remotely she had Barrett's esophagus.  She thinks her EGD was 10 to 15 years ago.  She states she had some baseline blood work about 1 month ago, no results of that on file today.  She denies any kidney disease.  No history of osteoporosis.  She has been recently diagnosed with interstitial lung disease and undergoing work-up with pulmonary for that.  She has also been recently diagnosed with OSA.  She is accompanied by a friend in the office today  Prior work-up Colonoscopy 11/15/2017 - The perianal and digital rectal examinations were normal. - Three sessile polyps were found in the ascending colon. The polyps were 3 to 6 mm in size (the largest was not photographed). These polyps were removed with a cold snare. Resection and retrieval were complete. - A few small-mouthed diverticula were found in the sigmoid colon. - The colon was tortuous. - The exam was otherwise without abnormality on direct and retroflexion views.  Surgical [P], ascneding, polyp (3) - TUBULAR ADENOMA (X3 FRAGMENTS). - NO HIGH GRADE DYSPLASIA OR MALIGNANCY.   Colonoscopy 07/06/2021 - The examined portion of the ileum was normal. - One diminutive polyp in the cecum, removed with a cold biopsy forceps. Resected and retrieved. - Two diminutive polyps in the ascending colon, removed with a cold biopsy forceps. Resected and retrieved. - Four 3 to 5  mm polyps in the transverse colon, removed with a cold snare. Resected and retrieved. - One 3 mm polyp at the recto-sigmoid colon, removed with a cold snare. Resected and retrieved. - Diverticulosis in the sigmoid colon. - Internal hemorrhoids. - The examination was otherwise normal. - Biopsies were taken with a cold forceps from the right colon, left colon and transverse colon for evaluation of microscopic colitis.  FINAL MICROSCOPIC DIAGNOSIS:   A. COLON, CECUM, ASCENDING, TRANSVERSE, POLYPECTOMY:  - Tubular adenoma (multiple fragments).  - No high grade  dysplasia or malignancy.   B. COLON, RANDOM, BIOPSY:  - Benign colonic mucosa.  - No active inflammation or evidence of microscopic colitis.  - No dysplasia or malignancy.   Past Medical History:  Diagnosis Date   Allergy    Diverticulosis    GERD (gastroesophageal reflux disease)    History of hiatal hernia    Hyperlipidemia    Hypertension    Hypothyroidism    Osteoarthritis    Post-operative nausea and vomiting    SVD (spontaneous vaginal delivery)    x 2   Tubular adenoma of colon      Past Surgical History:  Procedure Laterality Date   ABLATION SAPHENOUS VEIN W/ RFA     APPENDECTOMY     BIOPSY  07/06/2021   Procedure: BIOPSY;  Surgeon: Yetta Flock, MD;  Location: [facility];  Service: Gastroenterology;;   CERVICAL FUSION     CERVICAL LAMINECTOMY     CHOLECYSTECTOMY     COLONOSCOPY  10/2018   polyps   COLONOSCOPY WITH PROPOFOL N/A 07/06/2021   Procedure: COLONOSCOPY WITH PROPOFOL;  Surgeon: Yetta Flock, MD;  Location: [facility];  Service: Gastroenterology;  Laterality: N/A;   JOINT REPLACEMENT Bilateral 2019   LUMBAR FUSION  03/2019   POLYPECTOMY  07/06/2021   Procedure: POLYPECTOMY;  Surgeon: Yetta Flock, MD;  Location: [facility];  Service: Gastroenterology;;   THYROID SURGERY     TONSILLECTOMY     TOTAL KNEE ARTHROPLASTY Right 08/2016   UPPER GI ENDOSCOPY     Barrett's - no longer an issue per patient 04/13/19   WISDOM TOOTH EXTRACTION     Family History  Problem Relation Age of Onset   Colon polyps Mother    Allergies Mother    Clotting disorder Mother    Cancer Father        lung   Stroke Maternal Grandmother    Cancer Maternal Grandfather        colon   Colon cancer Maternal Grandfather        70's   Esophageal cancer Neg Hx    Stomach cancer Neg Hx    Rectal cancer Neg Hx    Social History   Tobacco Use   Smoking status: Never   Smokeless tobacco: Never  Vaping Use   Vaping Use: Never used  Substance Use  Topics   Alcohol use: Yes    Comment: occ wine   Drug use: No   Current Outpatient Medications  Medication Sig Dispense Refill   ADVAIR DISKUS 250-50 MCG/ACT AEPB Inhale 1 puff into the lungs 2 (two) times daily.     atenolol (TENORMIN) 25 MG tablet Take 25 mg by mouth daily.     levothyroxine (SYNTHROID) 125 MCG tablet TAKE 1 TABLET (125 MCG TOTAL) BY MOUTH DAILY BEFORE BREAKFAST. 90 tablet 1   lisinopril (ZESTRIL) 5 MG tablet Take 5 mg by mouth daily.     pantoprazole (PROTONIX) 40 MG tablet Take  40 mg by mouth daily.     sertraline (ZOLOFT) 50 MG tablet Take 50 mg by mouth daily.     simvastatin (ZOCOR) 20 MG tablet Take 20 mg by mouth at bedtime.      No current facility-administered medications for this visit.   Allergies  Allergen Reactions   Codeine Nausea And Vomiting and Rash    Any pain medication     Review of Systems: All systems reviewed and negative except where noted in HPI.      Physical Exam: BP (!) 160/80   Pulse 64   Ht 5\' 10"  (1.778 m)   Wt 242 lb (109.8 kg)   LMP  (LMP Unknown)   BMI 34.72 kg/m  Constitutional: Pleasant,well-developed, female in no acute distress. Neurological: Alert and oriented to person place and time. Psychiatric: Normal mood and affect. Behavior is normal.   ASSESSMENT AND PLAN: 75 year old female here for reassessment of the following:  IBS-D Dysphagia GERD  Long term use of PPI  As above, ongoing intermittent loose stools for years.  This appears to be improved since starting Benefiber.  Stools are more formed and at home not really too bothersome but more concerned about having loose stools when eating in restaurants.  We discussed strategies to deal with this.  If her episodes are infrequent and only when eating out, she can try taking some Imodium, 2 tablets prior to leaving the house and see if that will help.  Reassured her that colonoscopy did not show anything concerning and she is states she has been evaluated for  celiac in the past and negative.  We discussed her history of reflux.  I will try to obtain her prior EGD report to clarify findings, although she thinks she does not have Barrett's esophagus.  I did discuss long-term use of chronic PPIs with her, associated risks.  Long-term want to use the lowest dose of PPI needed to control symptoms.  Sounds like she is rather sensitive in regards to her Protonix at this point and if she misses 1 dose she will have significant rebound.  She may not be able to reduce dose but she can try decreasing to 20 mg a day if tolerated.  Otherwise, sounds like she may have some oropharyngeal dysphagia based on her liquid dysphagia and symptoms concerning for aspiration.  We will evaluate this with a modified barium swallow.  She agreed with this.  Plan: - continue benefiber - use immodium PRN, for example when eating out in restaurants - obtain old EGD report from Dr. Orene Desanctis office if possible - discussed long term risks of PPIs - continue protonix 40mg , titrate to lower dose if tolerated - modified barium swallow  Further recommendations pending her course and results.  Jolly Mango, MD [facility]

## 2021-07-28 NOTE — Patient Instructions (Addendum)
If you are age 77 or older, your body mass index should be between 23-30. Your Body mass index is 34.72 kg/m. If this is out of the aforementioned range listed, please consider follow up with your Primary Care Provider.  If you are age 69 or younger, your body mass index should be between 19-25. Your Body mass index is 34.72 kg/m. If this is out of the aformentioned range listed, please consider follow up with your Primary Care Provider.   __________________________________________________________  The [place] [facility] providers would like to encourage you to use [facility] to communicate with providers for non-urgent requests or questions.  Due to long hold times on the telephone, sending your provider a message by [facility] may be a faster and more efficient way to get a response.  Please allow 48 business hours for a response.  Please remember that this is for non-urgent requests.    Continue Benefiber  Continue Protonix.  Please purchase the following medications over the counter and take as directed: Imodium - Use as needed.  You have been scheduled for a modified barium swallow on Wednesday, 08-05-21 at 1:00 pm. Please arrive 15 minutes prior to your test for registration. You will go to [facility]  Radiology (1st Floor) for your appointment. Should you need to cancel or reschedule your appointment, please contact [phone] [place] [address] [place]) or [phone] [place]). _____________________________________________________________________ A Modified Barium Swallow Study, or MBS, is a special x-ray that is taken to check swallowing skills. It is carried out by a Stage manager and a Psychologist, clinical (SLP). During this test, yourmouth, throat, and esophagus, a muscular tube which connects your mouth to your stomach, is checked. The test will help you, your doctor, and the SLP plan what types of foods and liquids are easier for you to swallow. The SLP will also identify positions and ways to  help you swallow more easily and safely. What will happen during an MBS? You will be taken to an x-ray room and seated comfortably. You will be asked to swallow small amounts of food and liquid mixed with barium. Barium is a liquid or paste that allows images of your mouth, throat and esophagus to be seen on x-ray. The x-ray captures moving images of the food you are swallowing as it travels from your mouth through your throat and into your esophagus. This test helps identify whether food or liquid is entering your lungs (aspiration). The test also shows which part of your mouth or throat lacks strength or coordination to move the food or liquid in the right direction. This test typically takes 30 minutes to 1 hour to complete. _______________________________________________________________________   Thank you for entrusting me with your care and for choosing [facility], Dr. [place] Cellar

## 2021-07-29 ENCOUNTER — Other Ambulatory Visit: Payer: Self-pay | Admitting: Nurse Practitioner

## 2021-07-30 ENCOUNTER — Telehealth: Payer: Self-pay | Admitting: Gastroenterology

## 2021-07-30 NOTE — Telephone Encounter (Signed)
Spoke with patient in regards to information below. Pt will proceed with modified barium swallow as scheduled. Pt verbalized understanding and had no concerns at the end of the call.

## 2021-07-30 NOTE — Telephone Encounter (Signed)
Results from prior EGDs arrived for this patient:  EGD 01/04/2005 - Dr. Algis Greenhouse - irregular z -line for which biopsies were obtained and negative for BE  EGD 03/22/2012 - Dr. Windy Fast - Z line normal, esophagus otherwise normal. Small HH, mild gastritis - H pylori negative, some inflammation of the duodenum but biopsies nonspecific.   Labs 05/31/2007 - negative celiac lab panel INCLUDING negative genetic testing for celiac - HLA DQ2/8 negative, ruling out celiac disease     [place] can you let the patient know I reviewed her prior exams and labs. No further EGD needed at this time. Thanks

## 2021-08-05 ENCOUNTER — Ambulatory Visit (HOSPITAL_COMMUNITY)
Admission: RE | Admit: 2021-08-05 | Discharge: 2021-08-05 | Disposition: A | Discharge status: Home / Self Care | Payer: Medicare Other | Source: Ambulatory Visit | Attending: Gastroenterology | Admitting: Gastroenterology

## 2021-08-05 ENCOUNTER — Other Ambulatory Visit: Payer: Self-pay

## 2021-08-05 DIAGNOSIS — R1310 Dysphagia, unspecified: Secondary | ICD-10-CM | POA: Diagnosis present

## 2021-08-05 DIAGNOSIS — R1312 Dysphagia, oropharyngeal phase: Secondary | ICD-10-CM | POA: Insufficient documentation

## 2021-08-05 DIAGNOSIS — K580 Irritable bowel syndrome with diarrhea: Secondary | ICD-10-CM | POA: Insufficient documentation

## 2021-08-05 DIAGNOSIS — K219 Gastro-esophageal reflux disease without esophagitis: Secondary | ICD-10-CM | POA: Insufficient documentation

## 2021-08-05 DIAGNOSIS — R059 Cough, unspecified: Secondary | ICD-10-CM

## 2021-08-05 DIAGNOSIS — Z79899 Other long term (current) drug therapy: Secondary | ICD-10-CM | POA: Diagnosis present

## 2021-08-05 NOTE — Progress Notes (Signed)
Objective Swallowing Evaluation: Type of Study: MBS-Modified Barium Swallow Study   Patient Details  Name: Pamela Mckinney MRN: [id number] Date of Birth: 1946/02/14  Today's Date: 08/05/2021 Time: SLP Start Time (ACUTE ONLY): 1305 -SLP Stop Time (ACUTE ONLY): 1335  SLP Time Calculation (min) (ACUTE ONLY): 30 min   Past Medical History:  Past Medical History:  Diagnosis Date   Allergy    Diverticulosis    GERD (gastroesophageal reflux disease)    History of hiatal hernia    Hyperlipidemia    Hypertension    Hypothyroidism    Osteoarthritis    Post-operative nausea and vomiting    SVD (spontaneous vaginal delivery)    x 2   Tubular adenoma of colon    Past Surgical History:  Past Surgical History:  Procedure Laterality Date   ABLATION SAPHENOUS VEIN W/ RFA     APPENDECTOMY     BIOPSY  07/06/2021   Procedure: BIOPSY;  Surgeon: Yetta Flock, MD;  Location: [facility];  Service: Gastroenterology;;   CERVICAL FUSION     CERVICAL LAMINECTOMY     CHOLECYSTECTOMY     COLONOSCOPY  10/2018   polyps   COLONOSCOPY WITH PROPOFOL N/A 07/06/2021   Procedure: COLONOSCOPY WITH PROPOFOL;  Surgeon: Yetta Flock, MD;  Location: [facility];  Service: Gastroenterology;  Laterality: N/A;   JOINT REPLACEMENT Bilateral 2019   LUMBAR FUSION  03/2019   POLYPECTOMY  07/06/2021   Procedure: POLYPECTOMY;  Surgeon: Yetta Flock, MD;  Location: [facility];  Service: Gastroenterology;;   THYROID SURGERY     TONSILLECTOMY     TOTAL KNEE ARTHROPLASTY Right 08/2016   UPPER GI ENDOSCOPY     Barrett's - no longer an issue per patient 04/13/19   WISDOM TOOTH EXTRACTION     HPI: 75 yo female referred by GI for MBS due to concerns oropharyngeal dysphagia as pt reports difficulty with liquids causing her to cough.   She has h/o cervical spine fusion x2 - last being 17 years ago.  She denies dysphagia after cervical spine surgeries.  Pt also with h/o reflux, Barrett's, allergies,  hypothyroidism, HTN, HLD.  She reports difficulty with intubation due to her cervical spine issues.  Pt reports issues with coughing with liquids- even with coffee.  She denies having issues swallowing solids or pills.  Left facial asymmetry and ? minimal right deviation of soft palate noted which pt reports has been present x1 year. Pt denies losing weight, pnas nor requiring heimlich manuever. She admit to recent diagnosis of ILD and states this has caused her to become dyspneic with effort. She is a retired RT.   Subjective: pt awake in chair    Assessment / Plan / Recommendation  CHL IP CLINICAL IMPRESSIONS 08/05/2021  Clinical Impression Patient presents with normal oropharyngeal swallow ability without aspiration or penetration of any consistency tested.   Piecemealing with cracker WFL.  Her dysphagia symptoms were not replicated during MBS (ie  no coughing) even with tested with "Ultra thin barium" 1/3 water to regular thin barium.  Thanks for this consult.  SLP Visit Diagnosis Dysphagia, oropharyngeal phase (R13.12)  Attention and concentration deficit following --  Frontal lobe and executive function deficit following --  Impact on safety and function No limitations      No flowsheet data found.   No flowsheet data found.  CHL IP DIET RECOMMENDATION 08/05/2021  SLP Diet Recommendations Regular solids;Thin liquid  Liquid Administration via Cup;Straw  Medication Administration Whole meds with liquid  Compensations Slow rate;Small sips/bites  Postural Changes Remain semi-upright after after feeds/meals (Comment);Seated upright at 90 degrees      No flowsheet data found.    No flowsheet data found.    No flowsheet data found.         CHL IP ORAL PHASE 08/05/2021  Oral Phase Impaired  Oral - Pudding Teaspoon --  Oral - Pudding Cup --  Oral - Honey Teaspoon --  Oral - Honey Cup --  Oral - Nectar Teaspoon --  Oral - Nectar Cup WFL  Oral - Nectar Straw --  Oral - Thin  Teaspoon --  Oral - Thin Cup WFL  Oral - Thin Straw WFL  Oral - Puree WFL  Oral - Mech Soft WFL;Piecemeal swallowing  Oral - Regular NT  Oral - Multi-Consistency --  Oral - Pill WFL;Decreased bolus cohesion;Delayed oral transit  Oral Phase - Comment Pt needed an extra liquids to transit barium tablet, piecemealing with solid WFL    CHL IP PHARYNGEAL PHASE 08/05/2021  Pharyngeal Phase WFL  Pharyngeal- Pudding Teaspoon --  Pharyngeal --  Pharyngeal- Pudding Cup --  Pharyngeal --  Pharyngeal- Honey Teaspoon --  Pharyngeal --  Pharyngeal- Honey Cup --  Pharyngeal --  Pharyngeal- Nectar Teaspoon --  Pharyngeal --  Pharyngeal- Nectar Cup --  Pharyngeal --  Pharyngeal- Nectar Straw --  Pharyngeal --  Pharyngeal- Thin Teaspoon --  Pharyngeal --  Pharyngeal- Thin Cup --  Pharyngeal --  Pharyngeal- Thin Straw --  Pharyngeal --  Pharyngeal- Puree --  Pharyngeal --  Pharyngeal- Mechanical Soft --  Pharyngeal --  Pharyngeal- Regular --  Pharyngeal --  Pharyngeal- Multi-consistency --  Pharyngeal --  Pharyngeal- Pill --  Pharyngeal --  Pharyngeal Comment --     CHL IP CERVICAL ESOPHAGEAL PHASE 08/05/2021  Cervical Esophageal Phase WFL  Pudding Teaspoon --  Pudding Cup --  Honey Teaspoon --  Honey Cup --  Nectar Teaspoon --  Nectar Cup --  Nectar Straw --  Thin Teaspoon --  Thin Cup --  Thin Straw --  Puree --  Mechanical Soft --  Regular --  Multi-consistency --  Pill --  Cervical Esophageal Comment --   Pamela Lime, MS [facility] SLP Acute Rehab Services Office [phone] Pager [phone]   [name] 08/05/2021, 1:53 PM

## 2021-12-08 ENCOUNTER — Other Ambulatory Visit: Payer: Self-pay | Admitting: Nurse Practitioner

## 2021-12-08 DIAGNOSIS — E890 Postprocedural hypothyroidism: Secondary | ICD-10-CM

## 2021-12-15 ENCOUNTER — Ambulatory Visit: Payer: Medicare Other | Admitting: Nurse Practitioner

## 2021-12-15 LAB — TSH: TSH: 2.36 (ref 0.41–5.90)

## 2021-12-17 ENCOUNTER — Encounter: Payer: Self-pay | Admitting: Nurse Practitioner

## 2021-12-31 ENCOUNTER — Encounter: Payer: Self-pay | Admitting: Nurse Practitioner

## 2021-12-31 ENCOUNTER — Ambulatory Visit (INDEPENDENT_AMBULATORY_CARE_PROVIDER_SITE_OTHER): Payer: Medicare Other | Admitting: Nurse Practitioner

## 2021-12-31 VITALS — BP 129/74 | HR 57 | Ht 70.0 in | Wt 237.0 lb

## 2021-12-31 DIAGNOSIS — E890 Postprocedural hypothyroidism: Secondary | ICD-10-CM | POA: Diagnosis not present

## 2021-12-31 MED ORDER — LEVOTHYROXINE SODIUM 125 MCG PO TABS: 125.0000 ug | ORAL_TABLET | Freq: Every day | ORAL | 3 refills | Status: DC

## 2021-12-31 NOTE — Progress Notes (Signed)
? ?12/31/2021 ?    ?Endocrinology Follow Up Visit ? ? ? ?Subjective:  ? ? Patient ID: [facility], female    DOB: 1946/03/03,  ? ? ?Past Medical History:  ?Diagnosis Date  ? Allergy   ? Diverticulosis   ? GERD (gastroesophageal reflux disease)   ? History of hiatal hernia   ? Hyperlipidemia   ? Hypertension   ? Hypothyroidism   ? Osteoarthritis   ? Post-operative nausea and vomiting   ? SVD (spontaneous vaginal delivery)   ? x 2  ? Tubular adenoma of colon   ? ?Past Surgical History:  ?Procedure Laterality Date  ? ABLATION SAPHENOUS VEIN W/ RFA    ? APPENDECTOMY    ? BIOPSY  07/06/2021  ? Procedure: BIOPSY;  Surgeon: Pamela Flock, MD;  [facility];  Service: Gastroenterology;;  ? CERVICAL FUSION    ? CERVICAL LAMINECTOMY    ? CHOLECYSTECTOMY    ? COLONOSCOPY  10/2018  ? polyps  ? COLONOSCOPY WITH PROPOFOL N/A 07/06/2021  ? Procedure: COLONOSCOPY WITH PROPOFOL;  Surgeon: Pamela Flock, MD;  Location: [facility];  Service: Gastroenterology;  Laterality: N/A;  ? JOINT REPLACEMENT Bilateral 2019  ? LUMBAR FUSION  03/2019  ? POLYPECTOMY  07/06/2021  ? Procedure: POLYPECTOMY;  Surgeon: Pamela Flock, MD;  [facility];  Service: Gastroenterology;;  ? THYROID SURGERY    ? TONSILLECTOMY    ? TOTAL KNEE ARTHROPLASTY Right 08/2016  ? UPPER GI ENDOSCOPY    ? Barrett's - no longer an issue per patient 04/13/19  ? WISDOM TOOTH EXTRACTION    ? ?Social History  ? ?Socioeconomic History  ? Marital status: Married  ?  Spouse name: Not on file  ? Number of children: 2  ? Years of education: Not on file  ? Highest education level: Not on file  ?Occupational History  ? Occupation: caregiver  ?Tobacco Use  ? Smoking status: Never  ? Smokeless tobacco: Never  ?Vaping Use  ? Vaping Use: Never used  ?Substance and Sexual Activity  ? Alcohol use: Yes  ?  Comment: occ wine  ? Drug use: No  ? Sexual activity: Not on file  ?Other Topics Concern  ? Not on file  ?Social History Narrative  ? Not on file   ? ?Social Determinants of Health  ? ?Financial Resource Strain: Not on file  ?Food Insecurity: Not on file  ?Transportation Needs: Not on file  ?Physical Activity: Not on file  ?Stress: Not on file  ?Social Connections: Not on file  ? ?Outpatient Encounter Medications as of 12/31/2021  ?Medication Sig  ? amLODipine (NORVASC) 5 MG tablet Take 5 mg by mouth daily.  ? atenolol (TENORMIN) 25 MG tablet Take 25 mg by mouth daily.  ? diclofenac Sodium (VOLTAREN) 1 % GEL SMARTSIG:Gram(s) Topical 3-4 Times Daily  ? lisinopril (ZESTRIL) 10 MG tablet Take 10 mg by mouth daily.  ? omeprazole (PRILOSEC) 40 MG capsule Take 40 mg by mouth daily.  ? sertraline (ZOLOFT) 50 MG tablet Take 50 mg by mouth daily.  ? simvastatin (ZOCOR) 20 MG tablet Take 20 mg by mouth at bedtime.   ? TRELEGY ELLIPTA 100-62.5-25 MCG/ACT AEPB Take 1 puff by mouth daily.  ? [DISCONTINUED] levothyroxine (SYNTHROID) 125 MCG tablet TAKE 1 TABLET BY MOUTH DAILY BEFORE BREAKFAST.  ? levothyroxine (SYNTHROID) 125 MCG tablet Take 1 tablet (125 mcg total) by mouth daily before breakfast.  ? [DISCONTINUED] ADVAIR DISKUS 250-50 MCG/ACT AEPB Inhale 1 puff  into the lungs 2 (two) times daily. (Patient not taking: Reported on 12/31/2021)  ? [DISCONTINUED] lisinopril (ZESTRIL) 5 MG tablet Take 5 mg by mouth daily. (Patient not taking: Reported on 12/31/2021)  ? [DISCONTINUED] pantoprazole (PROTONIX) 40 MG tablet Take 40 mg by mouth daily. (Patient not taking: Reported on 12/31/2021)  ? ?No facility-administered encounter medications on file as of 12/31/2021.  ? ?ALLERGIES: ?Allergies  ?Allergen Reactions  ? Codeine Nausea And Vomiting and Rash  ?  Any pain medication  ? ?VACCINATION STATUS: ? ?There is no immunization history on file for this patient. ? ?Thyroid Problem ?Presents for follow-up visit. Patient reports no anxiety, cold intolerance, constipation, diarrhea, dry skin, fatigue, heat intolerance, palpitations, tremors, weight gain or weight loss. (Has had nonstop  hot flashes for years now.  These have not improved since she was initiated on thyroid hormone.) The symptoms have been stable.  ? ?Pamela Mckinney is a 76 year old female with medical history as above.  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  ? ? She reports history of left  partial thyroidectomy at approximate age of 20 years due to a "cold nodule." She has taken thyroid hormone replacement at various doses over the years currently at 125 mcg. She is compliant with taking her medication properly.approximately 5 pounds, intentionally.   ? ?Review of systems ? ?Constitutional: + Minimally fluctuating body weight,  current Body mass index is 34.01 kg/m?. , no fatigue, no subjective hyperthermia, no subjective hypothermia ?Eyes: no blurry vision, no xerophthalmia ?ENT: no sore throat, no nodules palpated in throat, no dysphagia/odynophagia, no hoarseness ?Cardiovascular: no chest pain, no shortness of breath, no palpitations, no leg swelling ?Respiratory: no cough, no shortness of breath ?Gastrointestinal: no nausea/vomiting/diarrhea ?Musculoskeletal: no muscle/joint aches ?Skin: no rashes, no hyperemia ?Neurological: no tremors, no numbness, no tingling, no dizziness ?Psychiatric: no depression, no anxiety- lost her husband a few months ago ? ?Objective:  ?  ?BP 129/74   Pulse (!) 57   Ht '5\' 10"'$  (1.778 m)   Wt 237 lb (107.5 kg)   LMP  (LMP Unknown)   SpO2 98%   BMI 34.01 kg/m?   ?Wt Readings from Last 3 Encounters:  ?12/31/21 237 lb (107.5 kg)  ?07/28/21 242 lb (109.8 kg)  ?07/06/21 220 lb (99.8 kg)  ?  ?BP Readings from Last 3 Encounters:  ?12/31/21 129/74  ?07/28/21 (!) 160/80  ?07/06/21 (!) 162/59  ? ? ? ?Physical Exam- Limited ? ?Constitutional:  Body mass index is 34.01 kg/m?. , not in acute distress, normal state of mind ?Eyes:  EOMI, no exophthalmos ?Neck: Supple ?Cardiovascular: RRR, no murmurs, rubs, or gallops, no edema ?Respiratory: Adequate breathing efforts, no crackles,  rales, rhonchi, or wheezing ?Musculoskeletal: no gross deformities, strength intact in all four extremities, no gross restriction of joint movements ?Skin:  no rashes, no hyperemia ?Neurological: no tremor with outstretched hands ? ? ? ?Recent Results (from the past 2160 hour(s))  ?TSH     Status: None  ? Collection Time: 12/15/21 12:00 AM  ?Result Value Ref Range  ? TSH 2.36 0.41 - 5.90  ?  Comment: T4 Free 1.29  ? ?Labs from 09/02/2015 showed free T4 1.26, TSH elevated at 3.82 ?Thyroid ultrasound showed surgically absent left lobe, 2.2 x 0.7 x 1 cm right lobe with no nodularity, 3.9 mm isthmus. ? ? Latest Reference Range & Units 09/04/19 00:00 12/05/19 00:00 06/02/20 00:00 12/08/20 00:00 12/15/21 00:00  ?TSH 0.41 - 5.90  0.71 (E) 3.53 (E) 2.20 (  E) 2.67 (E) 2.36 (E)  ?(E): External lab result ? ?12/15/21 0000   ?Result status: Final  ?Resulting lab: OTHER  ?Reference range: 0.41 - 5.90  ?Value: 2.36  ?Comment: T4 Free 1.29  ? ? ?Assessment & Plan:  ? ?1. Postsurgical hypothyroidism ? ?She has established, longstanding postsurgical hypothyroidism at approximate age of 63 years after left hemithyroidectomy for a cold nodule. Her most recent surveillance thyroid ultrasound from 08/2017 is unremarkable for any further nodular lesions. See above. No intervention or additional surveillance is required at this time.  ? ?-Her previsit thyroid function tests are consistent with appropriate hormone replacement.  She is advised to continue her current dose of Levothyroxine at 125 mcg po daily before breakfast. ? ? - The correct intake of thyroid hormone (Levothyroxine, Synthroid), is on empty stomach first thing in the morning, with water, separated by at least 30 minutes from breakfast and other medications,  and separated by more than 4 hours from calcium, iron, multivitamins, acid reflux medications (PPIs). ? ?- This medication is a life-long medication and will be needed to correct thyroid hormone imbalances for the rest  of your life.  The dose may change from time to time, based on thyroid blood work. ? ?- It is extremely important to be consistent taking this medication, near the same time each morning. ? ? ? ?I advised patient

## 2021-12-31 NOTE — Patient Instructions (Signed)

## 2022-01-25 ENCOUNTER — Other Ambulatory Visit: Payer: Self-pay | Admitting: Nurse Practitioner

## 2022-11-16 ENCOUNTER — Telehealth: Payer: Self-pay | Admitting: Nurse Practitioner

## 2022-11-16 DIAGNOSIS — E890 Postprocedural hypothyroidism: Secondary | ICD-10-CM

## 2022-11-16 NOTE — Telephone Encounter (Signed)
New order in chart 

## 2022-11-16 NOTE — Telephone Encounter (Signed)
Pt is asking for her lab order to be updated. Please let me know and I will mail the pt the order. Thanks

## 2023-01-03 ENCOUNTER — Ambulatory Visit: Payer: Medicare Other | Admitting: Nurse Practitioner

## 2023-01-05 ENCOUNTER — Encounter: Payer: Self-pay | Admitting: Nurse Practitioner

## 2023-01-05 LAB — TSH: TSH: 3.03 (ref 0.41–5.90)

## 2023-01-24 ENCOUNTER — Encounter: Payer: Self-pay | Admitting: Nurse Practitioner

## 2023-01-24 ENCOUNTER — Ambulatory Visit (INDEPENDENT_AMBULATORY_CARE_PROVIDER_SITE_OTHER): Payer: Medicare Other | Admitting: Nurse Practitioner

## 2023-01-24 VITALS — BP 116/73 | HR 73 | Ht 70.0 in | Wt 238.8 lb

## 2023-01-24 DIAGNOSIS — E890 Postprocedural hypothyroidism: Secondary | ICD-10-CM | POA: Diagnosis not present

## 2023-01-24 MED ORDER — LEVOTHYROXINE SODIUM 125 MCG PO TABS: 125.0000 ug | ORAL_TABLET | Freq: Every day | ORAL | 3 refills | Status: AC

## 2023-01-24 NOTE — Progress Notes (Signed)
01/24/2023     Endocrinology Follow Up Visit    Subjective:    Patient ID: [facility], female    DOB: 07-27-1946,    Past Medical History:  Diagnosis Date   Allergy    Diverticulosis    GERD (gastroesophageal reflux disease)    History of hiatal hernia    Hyperlipidemia    Hypertension    Hypothyroidism    Osteoarthritis    Post-operative nausea and vomiting    SVD (spontaneous vaginal delivery)    x 2   Tubular adenoma of colon    Past Surgical History:  Procedure Laterality Date   ABLATION SAPHENOUS VEIN W/ RFA     APPENDECTOMY     BIOPSY  07/06/2021   Procedure: BIOPSY;  Surgeon: Benancio Deeds, MD;  Location: [facility];  Service: Gastroenterology;;   CERVICAL FUSION     CERVICAL LAMINECTOMY     CHOLECYSTECTOMY     COLONOSCOPY  10/2018   polyps   COLONOSCOPY WITH PROPOFOL N/A 07/06/2021   Procedure: COLONOSCOPY WITH PROPOFOL;  Surgeon: Benancio Deeds, MD;  Location: [facility];  Service: Gastroenterology;  Laterality: N/A;   JOINT REPLACEMENT Bilateral 2019   LUMBAR FUSION  03/2019   POLYPECTOMY  07/06/2021   Procedure: POLYPECTOMY;  Surgeon: Benancio Deeds, MD;  Location: [facility];  Service: Gastroenterology;;   THYROID SURGERY     TONSILLECTOMY     TOTAL KNEE ARTHROPLASTY Right 08/2016   UPPER GI ENDOSCOPY     Barrett's - no longer an issue per patient 04/13/19   WISDOM TOOTH EXTRACTION     Social History   Socioeconomic History   Marital status: Married    Spouse name: Not on file   Number of children: 2   Years of education: Not on file   Highest education level: Not on file  Occupational History   Occupation: caregiver  Tobacco Use   Smoking status: Never   Smokeless tobacco: Never  Vaping Use   Vaping Use: Never used  Substance and Sexual Activity   Alcohol use: Yes    Comment: occ wine   Drug use: No   Sexual activity: Not on file  Other Topics Concern   Not on file  Social History Narrative   Not on file    Social Determinants of Health   Financial Resource Strain: Not on file  Food Insecurity: Not on file  Transportation Needs: Not on file  Physical Activity: Not on file  Stress: Not on file  Social Connections: Not on file   Outpatient Encounter Medications as of 01/24/2023  Medication Sig   amLODipine (NORVASC) 5 MG tablet Take 5 mg by mouth daily.   diltiazem (TIAZAC) 240 MG 24 hr capsule Take 240 mg by mouth daily.   ELIQUIS 5 MG TABS tablet Take 1 tablet by mouth every 12 (twelve) hours.   estradiol (ESTRACE) 0.1 MG/GM vaginal cream Place 1 Applicatorful vaginally 2 (two) times a week.   flecainide (TAMBOCOR) 100 MG tablet Take 1 tablet by mouth 2 (two) times daily.   furosemide (LASIX) 20 MG tablet Take 40 mg by mouth daily.   metoprolol succinate (TOPROL-XL) 25 MG 24 hr tablet Take 1 tablet by mouth daily.   omeprazole (PRILOSEC) 40 MG capsule Take 40 mg by mouth daily.   sertraline (ZOLOFT) 50 MG tablet Take 50 mg by mouth daily.   TRELEGY ELLIPTA 100-62.5-25 MCG/ACT AEPB Take 1 puff by mouth daily.   [DISCONTINUED] levothyroxine (SYNTHROID) 125  MCG tablet Take 1 tablet (125 mcg total) by mouth daily before breakfast.   diclofenac Sodium (VOLTAREN) 1 % GEL SMARTSIG:Gram(s) Topical 3-4 Times Daily (Patient not taking: Reported on 01/24/2023)   levothyroxine (SYNTHROID) 125 MCG tablet Take 1 tablet (125 mcg total) by mouth daily before breakfast.   lisinopril (ZESTRIL) 10 MG tablet Take 10 mg by mouth daily. (Patient not taking: Reported on 01/24/2023)   simvastatin (ZOCOR) 20 MG tablet Take 20 mg by mouth at bedtime.  (Patient not taking: Reported on 01/24/2023)   [DISCONTINUED] atenolol (TENORMIN) 25 MG tablet Take 25 mg by mouth daily. (Patient not taking: Reported on 01/24/2023)   No facility-administered encounter medications on file as of 01/24/2023.   ALLERGIES: Allergies  Allergen Reactions   Codeine Nausea And Vomiting and Rash    Any pain medication   VACCINATION  STATUS: Immunization History  Administered Date(s) Administered   Moderna Sars-Covid-2 Vaccination 11/10/2019, 12/08/2019, 06/30/2020    Thyroid Problem Presents for follow-up visit. Patient reports no anxiety, cold intolerance, constipation, diarrhea, dry skin, fatigue, heat intolerance, palpitations, tremors, weight gain or weight loss. (Has had nonstop hot flashes for years now.  These have not improved since she was initiated on thyroid hormone.) The symptoms have been stable.    [facility] is a 77 year old female with medical history as above.  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    She reports history of left  partial thyroidectomy at approximate age of 14 years due to a "cold nodule." She has taken thyroid hormone replacement at various doses over the years currently at 125 mcg. She is compliant with taking her medication properly.   Review of systems  Constitutional: + Minimally fluctuating body weight,  current Body mass index is 34.26 kg/m. , no fatigue, no subjective hyperthermia, no subjective hypothermia Eyes: no blurry vision, no xerophthalmia ENT: no sore throat, no nodules palpated in throat, no dysphagia/odynophagia, no hoarseness Cardiovascular: no chest pain, + shortness of breath, + palpitations (back in [facility]-sees specialist tomorrow), no leg swelling Respiratory: no cough, no shortness of breath Gastrointestinal: no nausea/vomiting/diarrhea Musculoskeletal: no muscle/joint aches Skin: no rashes, no hyperemia Neurological: no tremors, no numbness, no tingling, no dizziness Psychiatric: no depression, no anxiety  Objective:    BP 116/73 (BP Location: Left Arm, Patient Position: Sitting, Cuff Size: Large)   Pulse 73   Ht 5\' 10"  (1.778 m)   Wt 238 lb 12.8 oz (108.3 kg)   LMP  (LMP Unknown)   BMI 34.26 kg/m   Wt Readings from Last 3 Encounters:  01/24/23 238 lb 12.8 oz (108.3 kg)  12/31/21 237 lb (107.5 kg)  07/28/21 242 lb (109.8  kg)    BP Readings from Last 3 Encounters:  01/24/23 116/73  12/31/21 129/74  07/28/21 (!) 160/80     Physical Exam- Limited  Constitutional:  Body mass index is 34.26 kg/m. , not in acute distress, normal state of mind Eyes:  EOMI, no exophthalmos Musculoskeletal: no gross deformities, strength intact in all four extremities, no gross restriction of joint movements Skin:  no rashes, no hyperemia Neurological: no tremor with outstretched hands    Recent Results (from the past 2160 hour(s))  TSH     Status: None   Collection Time: 01/05/23 12:00 AM  Result Value Ref Range   TSH 3.03 0.41 - 5.90    Comment: Free T4 1.31   Labs from 09/02/2015 showed free T4 1.26, TSH elevated at 3.82 Thyroid ultrasound showed surgically absent  left lobe, 2.2 x 0.7 x 1 cm right lobe with no nodularity, 3.9 mm isthmus.   Latest Reference Range & Units 09/04/19 00:00 12/05/19 00:00 06/02/20 00:00 12/08/20 00:00 12/15/21 00:00  TSH 0.41 - 5.90  0.71 (E) 3.53 (E) 2.20 (E) 2.67 (E) 2.36 (E)  (E): External lab result  12/15/21 0000   Result status: Final  Resulting lab: OTHER  Reference range: 0.41 - 5.90  Value: 2.36  Comment: T4 Free 1.29    Assessment & Plan:   1. Postsurgical hypothyroidism  She has established, longstanding postsurgical hypothyroidism at approximate age of 77 years after left hemithyroidectomy for a cold nodule. Her most recent surveillance thyroid ultrasound from 08/2017 is unremarkable for any further nodular lesions. See above. No intervention or additional surveillance is required at this time.   -Her previsit thyroid function tests are consistent with appropriate hormone replacement.  She is advised to continue her current dose of Levothyroxine at 125 mcg po daily before breakfast.  I did advise her to reach out to me if her cardiologist puts her on Amiodarone as she may need more frequent follow up given its tendencies to alter thyroid function.   - The correct  intake of thyroid hormone (Levothyroxine, Synthroid), is on empty stomach first thing in the morning, with water, separated by at least 30 minutes from breakfast and other medications,  and separated by more than 4 hours from calcium, iron, multivitamins, acid reflux medications (PPIs).  - This medication is a life-long medication and will be needed to correct thyroid hormone imbalances for the rest of your life.  The dose may change from time to time, based on thyroid blood work.  - It is extremely important to be consistent taking this medication, near the same time each morning.    I advised patient to maintain close follow up with her PCP for primary care needs.    I spent  18  minutes in the care of the patient today including review of labs from Thyroid Function, CMP, and other relevant labs ; imaging/biopsy records (current and previous including abstractions from other facilities); face-to-face time discussing  her lab results and symptoms, medications doses, her options of short and long term treatment based on the latest standards of care / guidelines;   and documenting the encounter.  Humberto SealsJean D Luton  participated in the discussions, expressed understanding, and voiced agreement with the above plans.  All questions were answered to her satisfaction. she is encouraged to contact clinic should she have any questions or concerns prior to her return visit.    Follow up plan: Return in about 1 year (around 01/24/2024) for Thyroid follow up, Previsit labs.    Ronny BaconWhitney Arita Severtson, [facility], LLCFNP-BC [facility] [address] [place], [place] [address] Phone: [phone] Fax: [phone]  01/24/2023, 2:47 PM

## 2023-01-24 NOTE — Patient Instructions (Signed)

## 2023-04-05 IMAGING — RF DG SWALLOWING FUNCTION
10 series · 24 of 24 positions shown · non-contrast
Comparison: None.

CLINICAL DATA: Dysphagia. Coughing after drinking fluids.

EXAM:
MODIFIED BARIUM SWALLOW
TECHNIQUE: Different consistencies of barium were administered orally to the
patient by the Speech Pathologist. Imaging of the pharynx was
performed in the lateral projection. The radiologist was present in
the fluoroscopy room for this study, providing personal supervision.
FLUOROSCOPY TIME:  Fluoroscopy Time: 1 minute and 12 seconds of
low-dose pulsed fluoroscopy
Radiation Exposure Index (if provided by the fluoroscopic device):
9.6 mGy
Number of Acquired Spot Images: 0

[Series 1: cp_standard · 0.34mm/px · 3 of 56 frames shown (1 of 10)]
[frame 9/56]
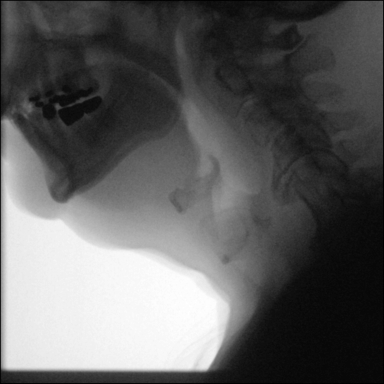
[frame 29/56]
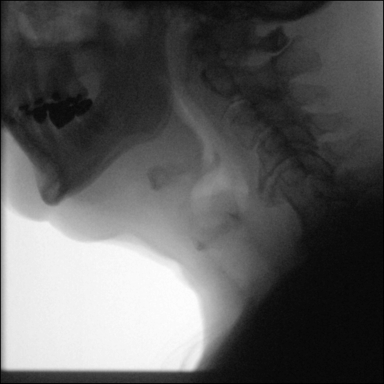
[frame 48/56]
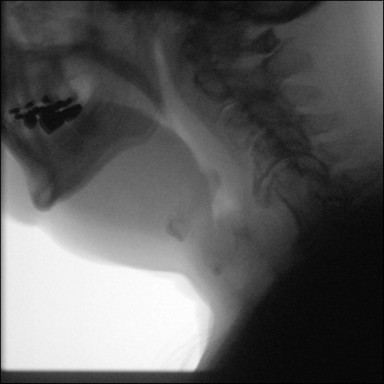

[Series 2: cp_standard · 0.34mm/px · 2 of 58 frames shown (2 of 10)]
[frame 30/58]
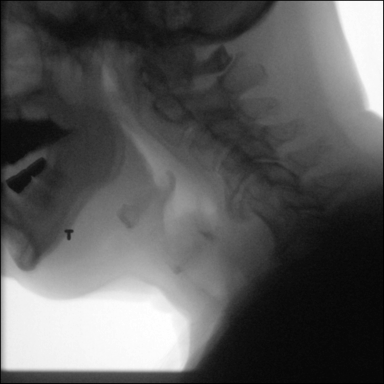
[frame 55/58]
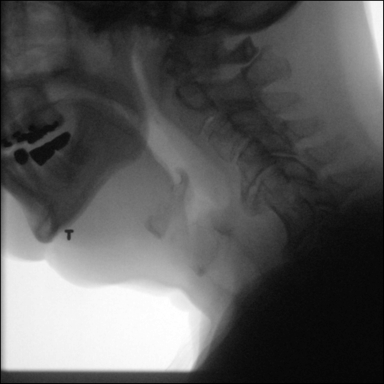

[Series 3: cp_standard · 0.34mm/px · 2 of 107 frames shown (3 of 10)]
[frame 17/107]
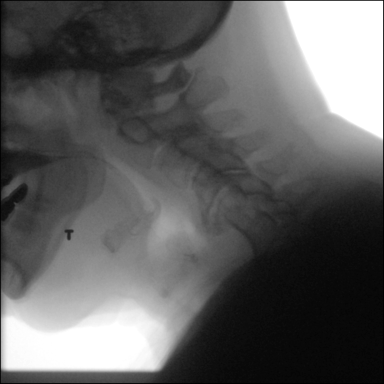
[frame 91/107]
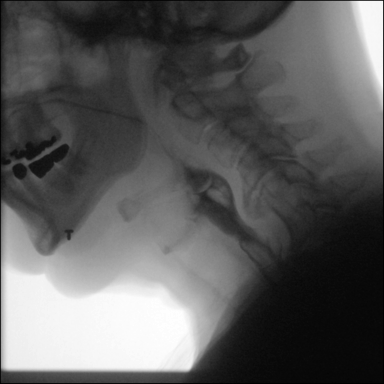

[Series 4: cp_standard · 0.34mm/px · 3 of 58 frames shown (4 of 10)]
[frame 9/58]
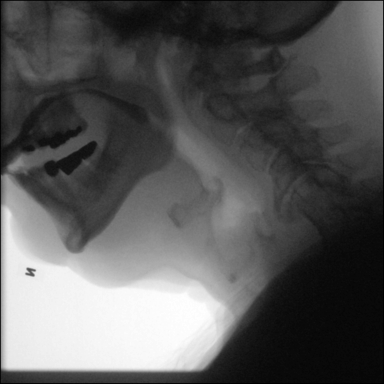
[frame 30/58]
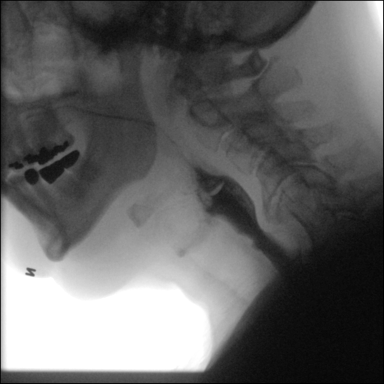
[frame 50/58]
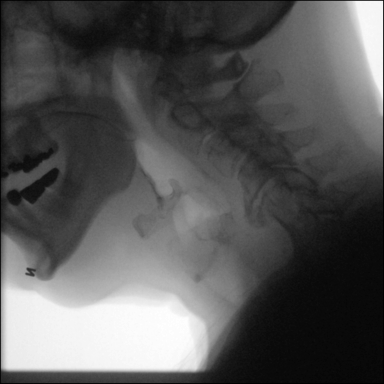

[Series 5: cp_standard · 0.34mm/px · 2 of 36 frames shown (5 of 10)]
[frame 19/36]
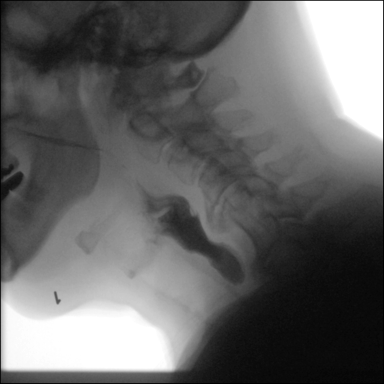
[frame 31/36]
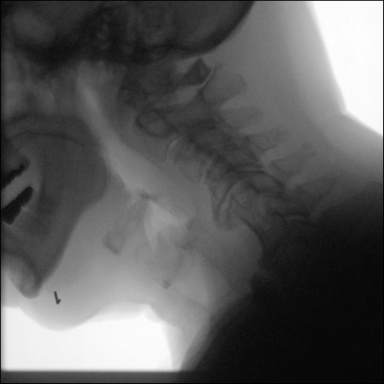

[Series 6: cp_standard · 0.34mm/px · 2 of 62 frames shown (6 of 10)]
[frame 10/62]
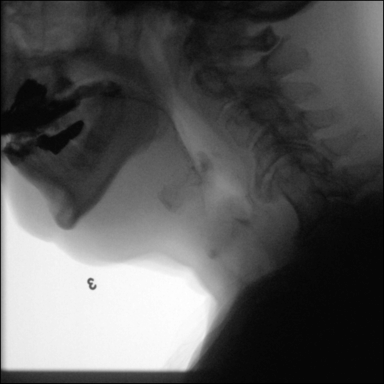
[frame 32/62]
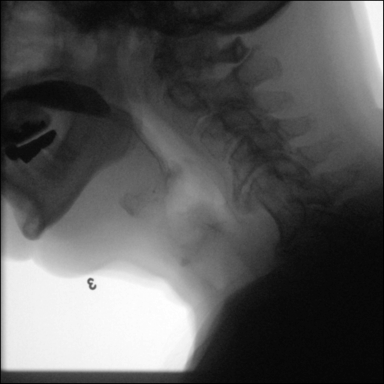

[Series 7: cp_standard · 0.34mm/px · 3 of 67 frames shown (7 of 10)]
[frame 11/67]
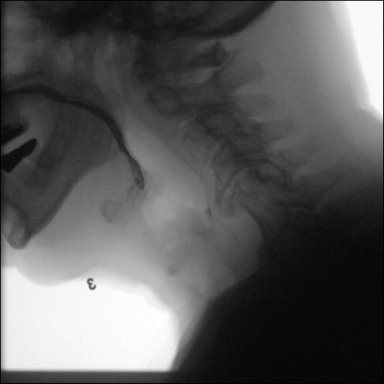
[frame 33/67]
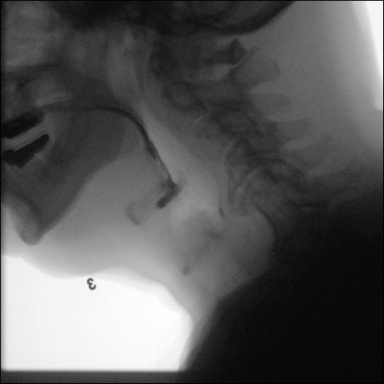
[frame 57/67]
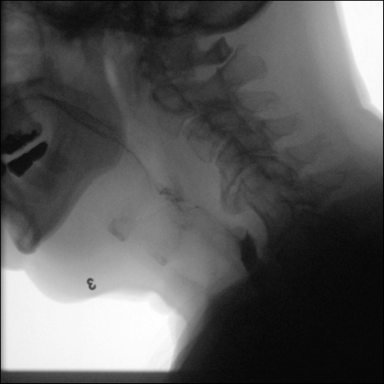

[Series 8: cp_standard · 0.34mm/px · 2 of 50 frames shown (8 of 10)]
[frame 11/50]
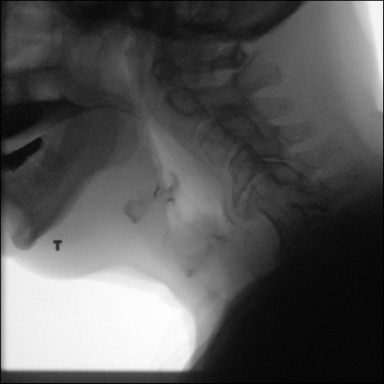
[frame 43/50]
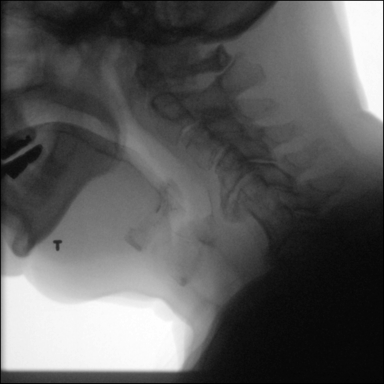

[Series 9: cp_standard · 0.34mm/px · 2 of 141 frames shown (9 of 10)]
[frame 22/141]
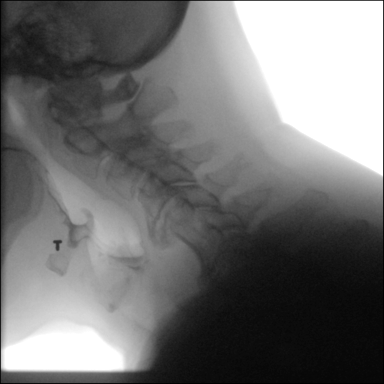
[frame 71/141]
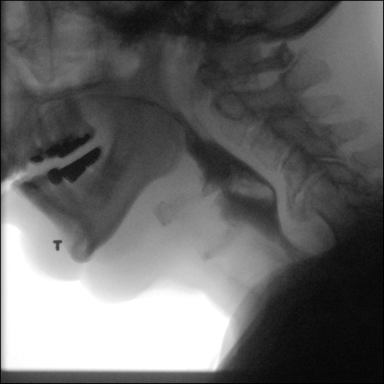

[Series 10: cp_standard · 0.34mm/px · 3 of 151 frames shown (10 of 10)]
[frame 23/151]
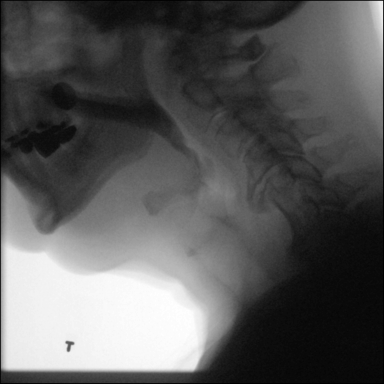
[frame 41/151]
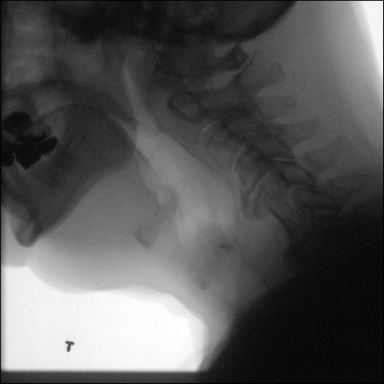
[frame 129/151]
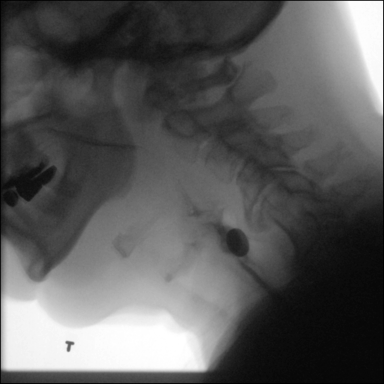

[24 of 24 positions shown; findings below may reference images not displayed]

FINDINGS: The swallowing function was within normal limits with all tested
consistencies. No laryngeal penetration or aspiration. No focal
mucosal abnormalities are identified. There are prominent anterior
osteophytes at C4-C5 status post C5-6 ACDF.

A barium tablet was administered and passed without significant
delay into the stomach.
IMPRESSION: No significant oropharyngeal swallowing abnormality demonstrated.

Please refer to the Speech Pathologists report for complete details
and recommendations.

## 2024-01-17 ENCOUNTER — Other Ambulatory Visit: Payer: Self-pay | Admitting: *Deleted

## 2024-01-17 ENCOUNTER — Telehealth: Payer: Self-pay | Admitting: Nurse Practitioner

## 2024-01-17 DIAGNOSIS — E890 Postprocedural hypothyroidism: Secondary | ICD-10-CM

## 2024-01-17 NOTE — Telephone Encounter (Signed)
Labs have been updated . 

## 2024-01-17 NOTE — Telephone Encounter (Signed)
 Can you update labs and let me know when completed so I can mail to pt  thanks

## 2024-01-24 ENCOUNTER — Ambulatory Visit: Payer: Medicare Other | Admitting: Nurse Practitioner

## 2024-04-16 ENCOUNTER — Telehealth: Payer: Self-pay | Admitting: Nurse Practitioner

## 2024-04-16 NOTE — Telephone Encounter (Signed)
 Pt needs labs updated

## 2024-04-16 NOTE — Telephone Encounter (Signed)
 Put in error, labs were already updated in 01/17/24

## 2024-04-16 NOTE — Telephone Encounter (Signed)
 Noted

## 2024-04-27 ENCOUNTER — Ambulatory Visit: Admitting: Nurse Practitioner
